# Patient Record
Sex: Female | Born: 1995 | Race: White | Hispanic: No | Marital: Single | Smoking: Never smoker
Health system: Southern US, Community
[De-identification: ages and names within clinical notes are randomized; demographics above are authoritative.]

## PROBLEM LIST (undated history)

## (undated) DIAGNOSIS — J302 Other seasonal allergic rhinitis: Secondary | ICD-10-CM

## (undated) HISTORY — DX: Other seasonal allergic rhinitis: J30.2

---
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
## 2015-02-06 ENCOUNTER — Encounter: Payer: Self-pay | Admitting: Pulmonary Disease

## 2015-02-06 ENCOUNTER — Other Ambulatory Visit (INDEPENDENT_AMBULATORY_CARE_PROVIDER_SITE_OTHER): Payer: BC Managed Care – PPO

## 2015-02-06 ENCOUNTER — Ambulatory Visit (INDEPENDENT_AMBULATORY_CARE_PROVIDER_SITE_OTHER): Payer: BC Managed Care – PPO | Admitting: Pulmonary Disease

## 2015-02-06 VITALS — BP 116/62 | HR 85 | Temp 97.6°F | Ht 67.0 in | Wt 139.8 lb

## 2015-02-06 DIAGNOSIS — J45990 Exercise induced bronchospasm: Secondary | ICD-10-CM

## 2015-02-06 LAB — CBC WITH DIFFERENTIAL/PLATELET
BASOS ABS: 0 10*3/uL (ref 0.0–0.1)
BASOS PCT: 0.3 % (ref 0.0–3.0)
EOS ABS: 0.1 10*3/uL (ref 0.0–0.7)
Eosinophils Relative: 1.1 % (ref 0.0–5.0)
HEMATOCRIT: 42.6 % (ref 36.0–46.0)
HEMOGLOBIN: 14.6 g/dL (ref 12.0–15.0)
LYMPHS PCT: 28.6 % (ref 12.0–46.0)
Lymphs Abs: 3 10*3/uL (ref 0.7–4.0)
MCHC: 34.3 g/dL (ref 30.0–36.0)
MCV: 87.7 fl (ref 78.0–100.0)
MONOS PCT: 5.1 % (ref 3.0–12.0)
Monocytes Absolute: 0.5 10*3/uL (ref 0.1–1.0)
NEUTROS ABS: 6.8 10*3/uL (ref 1.4–7.7)
Neutrophils Relative %: 64.9 % (ref 43.0–77.0)
PLATELETS: 389 10*3/uL (ref 150.0–400.0)
RBC: 4.86 Mil/uL (ref 3.87–5.11)
RDW: 13.2 % (ref 11.5–15.5)
WBC: 10.5 10*3/uL (ref 4.0–10.5)

## 2015-02-06 MED ORDER — BECLOMETHASONE DIPROPIONATE 40 MCG/ACT IN AERS: 1.0000 | INHALATION_SPRAY | Freq: Two times a day (BID) | RESPIRATORY_TRACT | Status: DC

## 2015-02-06 NOTE — Progress Notes (Signed)
   Subjective:    Patient ID: Melanie Drake, female    DOB: 1995-05-21, 19 y.o.   MRN: [id number]  HPI Consult for evaluation of asthma.  Melanie Drake is a 19 year old with a diagnosis of asthma since the age of 40. Her symptoms are induced mainly by exercise. She has been maintained on albuterol when necessary. She was prescribed Qvar 2 years ago by her primary care physician. She took it for a couple of months and then stopped.   She goes to school in [place]. There she has to walk up a hill to get her classes. She reports that her shortness of breath has returned in the month of August when she started her classes. She had to use her albuterol a couple of times every day. This has since improved and she is taking her albuterol 1-2 times per week. She describes her symptoms as chest tightness and shortness of breath wheezing with cough.  Past medical history/past social history: Exercise-induced asthma. Seasonal allergies.  Allergies: None.  Past Medical History  Diagnosis Date  . Asthma 05/2003    dx. by ped. doc.   '  Current outpatient prescriptions:  .  albuterol (PROVENTIL,VENTOLIN) 90 MCG/ACT inhaler, Inhale 2 puffs into the lungs every 6 (six) hours as needed for wheezing., Disp: 17 g, Rfl: 6 .  beclomethasone (QVAR) 40 MCG/ACT inhaler, Inhale 1 puff into the lungs 2 (two) times daily., Disp: 1 Inhaler, Rfl: 6 .  beclomethasone (QVAR) 80 MCG/ACT inhaler, Inhale 1 puff into the lungs 2 (two) times daily., Disp: 1 Inhaler, Rfl: 0  Review of Systems Complains of chest tightness, shortness of breath on exertion, cough. No mucus production, no chest pain, palpitations No nausea, vomiting, diarrhea. All other review of systems are negative.  Blood pressure 116/62, pulse 85, temperature 97.6 F (36.4 C), temperature source Oral, height  (1.702 m), weight 139 lb 12.8 oz (63.413 kg), SpO2 96 %.     Objective:   Physical Exam Gen.: Awake alert, no distress. HEENT: Moist  mucous membranes, PERRLA Neck: Supple no JVD RS: No wheeze or crackles. CVS: S1-S2 heard, no MRG. Abdomen: Soft, positive bowel sounds Extremities: No edema. Skin: Intact.    Assessment & Plan:   Exercise-induced asthma. Moderate persistent asthma.  Her symptoms have previously been controlled with albuterol when necessary. But this year she is having to use her rescue inhaler more often than usual. I will get PFTs with a bronchodilator response and also check a CBC with differential, IgE. She will benefit from long-term controller medication. I will restart her on the Qvar. Continue albuterol as needed and before exercise. She will return to clinic in a couple of months to assess response to therapy.  Plan: - Continue albuterol PRN. - Start Qvar inhaler - PFTs, CBC with diff, IgE  Follow up in 1-2 months.  Chilton Greathouse MD [facility] Pager [phone] If no answer or after 3pm call: [phone] 02/06/2015, 2:11 PM -

## 2015-02-06 NOTE — Patient Instructions (Signed)
Continue albuterol as needed Start Qvar inhaler Get lung function tests and blood tests.  Return in 1-2 months.

## 2015-02-07 ENCOUNTER — Ambulatory Visit (HOSPITAL_COMMUNITY)
Admission: RE | Admit: 2015-02-07 | Discharge: 2015-02-07 | Disposition: A | Discharge status: Home / Self Care | Payer: BC Managed Care – PPO | Source: Ambulatory Visit | Attending: Pulmonary Disease | Admitting: Pulmonary Disease

## 2015-02-07 DIAGNOSIS — J45990 Exercise induced bronchospasm: Secondary | ICD-10-CM | POA: Diagnosis present

## 2015-02-07 LAB — PULMONARY FUNCTION TEST
DL/VA % pred: 85 %
DL/VA: 4.4 ml/min/mmHg/L
DLCO COR % PRED: 79 %
DLCO COR: 22.37 ml/min/mmHg
DLCO UNC: 23.16 ml/min/mmHg
DLCO unc % pred: 81 %
FEF 25-75 POST: 3.65 L/s
FEF 25-75 PRE: 2.8 L/s
FEF2575-%Change-Post: 30 %
FEF2575-%PRED-PRE: 72 %
FEF2575-%Pred-Post: 93 %
FEV1-%Change-Post: 7 %
FEV1-%PRED-POST: 91 %
FEV1-%Pred-Pre: 85 %
FEV1-POST: 3.3 L
FEV1-Pre: 3.07 L
FEV1FVC-%Change-Post: 3 %
FEV1FVC-%Pred-Pre: 93 %
FEV6-%CHANGE-POST: 3 %
FEV6-%PRED-PRE: 92 %
FEV6-%Pred-Post: 95 %
FEV6-POST: 3.97 L
FEV6-Pre: 3.83 L
FEV6FVC-%PRED-POST: 100 %
FEV6FVC-%Pred-Pre: 100 %
FVC-%CHANGE-POST: 3 %
FVC-%Pred-Post: 95 %
FVC-%Pred-Pre: 92 %
FVC-Post: 3.97 L
FVC-Pre: 3.83 L
POST FEV6/FVC RATIO: 100 %
PRE FEV1/FVC RATIO: 80 %
Post FEV1/FVC ratio: 83 %
Pre FEV6/FVC Ratio: 100 %
RV % pred: 129 %
RV: 1.76 L
TLC % PRED: 102 %
TLC: 5.62 L

## 2015-02-07 LAB — IGE: IgE (Immunoglobulin E), Serum: 272 kU/L — ABNORMAL HIGH (ref <115)

## 2015-02-07 MED ORDER — ALBUTEROL SULFATE (2.5 MG/3ML) 0.083% IN NEBU
2.5000 mg | INHALATION_SOLUTION | Freq: Once | RESPIRATORY_TRACT | Status: AC
  Administered 2015-02-07: 2.5 mg via RESPIRATORY_TRACT

## 2015-03-07 ENCOUNTER — Telehealth: Payer: Self-pay | Admitting: Pulmonary Disease

## 2015-03-07 MED ORDER — ALBUTEROL SULFATE HFA 108 (90 BASE) MCG/ACT IN AERS: 2.0000 | INHALATION_SPRAY | Freq: Four times a day (QID) | RESPIRATORY_TRACT | Status: DC | PRN

## 2015-03-07 NOTE — Telephone Encounter (Signed)
Spoke with pt's mother. Rx has been sent in. Nothing further was needed.

## 2015-03-27 ENCOUNTER — Telehealth: Payer: Self-pay | Admitting: Behavioral Health

## 2015-03-27 NOTE — Telephone Encounter (Signed)
Unable to reach patient at time of Pre-Visit Call.  Left message for patient to return call when available.    

## 2015-03-28 ENCOUNTER — Encounter: Payer: Self-pay | Admitting: Family

## 2015-03-28 ENCOUNTER — Ambulatory Visit (INDEPENDENT_AMBULATORY_CARE_PROVIDER_SITE_OTHER): Payer: BC Managed Care – PPO | Admitting: Family

## 2015-03-28 VITALS — BP 122/66 | HR 70 | Temp 97.9°F | Resp 16 | Ht 67.0 in | Wt 144.8 lb

## 2015-03-28 DIAGNOSIS — J302 Other seasonal allergic rhinitis: Secondary | ICD-10-CM

## 2015-03-28 DIAGNOSIS — J45909 Unspecified asthma, uncomplicated: Secondary | ICD-10-CM | POA: Insufficient documentation

## 2015-03-28 DIAGNOSIS — J45990 Exercise induced bronchospasm: Secondary | ICD-10-CM

## 2015-03-28 DIAGNOSIS — J4540 Moderate persistent asthma, uncomplicated: Secondary | ICD-10-CM

## 2015-03-28 DIAGNOSIS — Z23 Encounter for immunization: Secondary | ICD-10-CM | POA: Diagnosis not present

## 2015-03-28 MED ORDER — BECLOMETHASONE DIPROPIONATE 40 MCG/ACT IN AERS: 1.0000 | INHALATION_SPRAY | Freq: Two times a day (BID) | RESPIRATORY_TRACT | Status: DC

## 2015-03-28 NOTE — Assessment & Plan Note (Signed)
Uncontrolled. Resume qvar, continue albuterol prn.
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
## 2015-03-28 NOTE — Progress Notes (Signed)
Pre visit review using our clinic review tool, if applicable. No additional management support is needed unless otherwise documented below in the visit note. 

## 2015-03-28 NOTE — Assessment & Plan Note (Signed)
Stable. Continue claritin or zyrtec prn.

## 2015-03-28 NOTE — Patient Instructions (Signed)
Restart qvar. Continue albuterol as needed.  Welcome to [facility]!

## 2015-04-04 NOTE — Addendum Note (Signed)
Addended by: Mervin KungFERGERSON, Satoru Milich A on: 04/04/2015 06:01 PM   Modules accepted: Orders

## 2015-04-17 ENCOUNTER — Telehealth: Payer: Self-pay | Admitting: *Deleted

## 2015-04-17 NOTE — Telephone Encounter (Signed)
I am trying to find pt in the [facility] database and need to verify her middle initial and has she had a previous phone # of [phone]. Left message for pt to return my call.

## 2015-04-17 NOTE — Telephone Encounter (Signed)
Pt returned your call. Pt says that her middle initial is M and no she never had the phone number below.    Advised pt if a call back is needed, you would be glad to fu with her.    Thanks.

## 2015-04-18 ENCOUNTER — Encounter: Payer: Self-pay | Admitting: Pulmonary Disease

## 2015-04-18 ENCOUNTER — Ambulatory Visit (INDEPENDENT_AMBULATORY_CARE_PROVIDER_SITE_OTHER): Payer: BC Managed Care – PPO | Admitting: Pulmonary Disease

## 2015-04-18 VITALS — BP 124/78 | HR 97 | Ht 67.0 in | Wt 142.8 lb

## 2015-04-18 DIAGNOSIS — J4540 Moderate persistent asthma, uncomplicated: Secondary | ICD-10-CM | POA: Diagnosis not present

## 2015-04-18 DIAGNOSIS — J45990 Exercise induced bronchospasm: Secondary | ICD-10-CM

## 2015-04-18 NOTE — Patient Instructions (Signed)
Return to clinic in 6 months. Continue taking the Qvar and albuterol rescue inhaler as ordered.

## 2015-04-18 NOTE — Progress Notes (Signed)
   Subjective:    Patient ID: Melanie Drake, female    DOB: [birth year], 19 y.o.   MRN: [id number]  PROBLEM LIST: Moderate persistent asthma Exercise-induced asthma.  HPI Ms. Melanie Drake is a 19 year old with a diagnosis of asthma since the age of 19. Her symptoms are induced mainly by exercise. She has been maintained on albuterol when necessary. She was prescribed Qvar 2 years ago by her primary care physician. She took it for a couple of months and then stopped.   [place] in [place] There she has to walk up a hill to get her classes. She reports that her shortness of breath has returned in the month of August when she started her classes. She had to use her albuterol a couple of times every day. This has since improved and she is taking her albuterol 1-2 times per week. She describes her symptoms as chest tightness and shortness of breath wheezing with cough.  Interim history: At the last visit she was restarted on Qvar. She had PFTs done which showed normal function. Today in the office she reports that her symptoms have improved by 90%. She hardly uses her rescue inhaler now.  PFTs [02/07/15] FVC 2.83 [92%] FEV1 3.07 [85%] F/F 80 FEF 25-75 percent 2.80 [72%], Post bronchodilator 3.65 [93%] +30% change TLC 5.5 [102%] DLCO 81% No obstruction by spirometry, reduction in mid flow rates suggestive of small airway disease. No restriction, normal diffusion capacity  [02/06/15] CBC- no eosinophilia IgE- 272  Past medical history/past social history: Exercise-induced asthma. Seasonal allergies.  Allergies: None.  Past Medical History  Diagnosis Date  . Asthma 05/2003    dx. by ped. doc.  . Seasonal allergies    '  Current outpatient prescriptions:  .  albuterol (PROVENTIL HFA;VENTOLIN HFA) 108 (90 BASE) MCG/ACT inhaler, Inhale 2 puffs into the lungs every 6 (six) hours as needed for wheezing or shortness of breath., Disp: 1 Inhaler, Rfl: 5 .  beclomethasone (QVAR) 40 MCG/ACT  inhaler, Inhale 1 puff into the lungs 2 (two) times daily., Disp: 1 Inhaler, Rfl: 3  Review of Systems Complains of chest tightness, shortness of breath on exertion, cough. No mucus production, no chest pain, palpitations No nausea, vomiting, diarrhea. All other review of systems are negative.     Objective:   Physical Exam   Blood pressure 124/78, pulse 97, height 5\' 7"  (1.702 m), weight 142 lb 12.8 oz (64.774 kg), last menstrual period 03/19/2015, SpO2 99 %.  Gen: Awake alert, no distress. HEENT: Moist mucous membranes, PERRLA Neck: Supple no JVD RS: No wheeze or crackles. CVS: S1-S2 heard, no MRG. Abdomen: Soft, positive bowel sounds Extremities: No edema. Skin: Intact.    Assessment & Plan:   Her symptoms have improved with Qvar. She hardly needs to use the albuterol inhaler. Review of her PFTs did not show any overt obstruction by F/F ratio however she has reduction in mid flow rates suggestive of small airways disease. She does not have any eosinophilia in her blood but she has an elevated IgE.  Plan: -Continue Qvar, albuterol rescue inhaler.  Return to clinic in 6 months   Chilton GreathousePraveen Jarvin Ogren MD [facility] Pager [phone] If no answer or after 3pm call: [phone] 04/18/2015, 1:53 PM -

## 2015-04-23 NOTE — Telephone Encounter (Signed)
Left message for pt's mother to return my call to verify if below # was a previous # for her.

## 2015-04-24 NOTE — Telephone Encounter (Signed)
Spoke with pt's mom. Was not previous # of hers. Googled # and it belongs to the health department. Immunizations entered under [facility].

## 2015-04-24 NOTE — Telephone Encounter (Signed)
Mother called and said pt phone # is [phone]

## 2015-07-16 ENCOUNTER — Telehealth: Payer: Self-pay | Admitting: Pulmonary Disease

## 2015-07-16 MED ORDER — ALBUTEROL SULFATE HFA 108 (90 BASE) MCG/ACT IN AERS: 2.0000 | INHALATION_SPRAY | Freq: Four times a day (QID) | RESPIRATORY_TRACT | Status: DC | PRN

## 2015-07-16 NOTE — Telephone Encounter (Signed)
[facility] [place] [place], states that her Ventolin is no longer covered.  Proair is covered by insurance. Gave verbal for okay to change. Nothing further needed.

## 2015-12-31 ENCOUNTER — Encounter: Payer: Self-pay | Admitting: Pulmonary Disease

## 2015-12-31 ENCOUNTER — Ambulatory Visit (INDEPENDENT_AMBULATORY_CARE_PROVIDER_SITE_OTHER): Payer: BC Managed Care – PPO | Admitting: Pulmonary Disease

## 2015-12-31 VITALS — BP 122/80 | HR 72 | Ht 67.0 in | Wt 146.2 lb

## 2015-12-31 DIAGNOSIS — J4540 Moderate persistent asthma, uncomplicated: Secondary | ICD-10-CM

## 2015-12-31 DIAGNOSIS — Z23 Encounter for immunization: Secondary | ICD-10-CM

## 2015-12-31 DIAGNOSIS — J45990 Exercise induced bronchospasm: Secondary | ICD-10-CM | POA: Diagnosis not present

## 2015-12-31 NOTE — Patient Instructions (Signed)
Continue using the Qvar inhaler. Flu vaccination today.  Return to clinic as needed.

## 2015-12-31 NOTE — Progress Notes (Signed)
Melanie Drake    [id number]    01/19/1996  Primary Care Physician:Drake,Melanie S., NP  Referring Physician: Sandford CrazeMelissa O'Sullivan, NP [address] [place], [place] [id number]  Chief complaint:   Follow up for  Moderate persistent asthma Exercise-induced asthma.  HPI: Ms. Melanie Drake is a 20 year old with a diagnosis of asthma since the age of 20. Her symptoms are induced mainly by exercise. She has been maintained on albuterol when necessary. She was prescribed Qvar 2 years ago by her primary care physician. She took it for a couple of months and then stopped.   [place] There she has to walk up a hill to get her classes. She reports that her shortness of breath has returned in the month of August when she started her classes. She had to use her albuterol a couple of times every day. This has since improved and she is taking her albuterol 1-2 times per week. She describes her symptoms as chest tightness and shortness of breath wheezing with cough.  Interim history: She continues to do well on Qvar. Her symptoms have essentially resolved. She hardly needs to use her rescue inhaler.   Outpatient Encounter Prescriptions as of 12/31/2015  Medication Sig  . albuterol (PROAIR HFA) 108 (90 Base) MCG/ACT inhaler Inhale 2 puffs into the lungs every 6 (six) hours as needed for wheezing or shortness of breath.  . beclomethasone (QVAR) 40 MCG/ACT inhaler Inhale 1 puff into the lungs 2 (two) times daily.   No facility-administered encounter medications on file as of 12/31/2015.     Allergies as of 12/31/2015 - Review Complete 12/31/2015  Allergen Reaction Noted  . Septra [bactrim] Rash 09/11/2010    Past Medical History:  Diagnosis Date  . Asthma 05/2003   dx. by ped. doc.  . Seasonal allergies     History reviewed. No pertinent surgical history.  Family History  Problem Relation Age of Onset  . Hypertension Mother   . Hypothyroidism Mother    . Multiple sclerosis Mother   . Bladder Cancer Maternal Grandfather   . Hyperlipidemia Father   . Diabetes Neg Hx   . Kidney disease Neg Hx     Social History   Social History  . Marital status: Single    Spouse name: N/A  . Number of children: N/A  . Years of education: N/A   Occupational History  . Not on file.   Social History Main Topics  . Smoking status: Never Smoker  . Smokeless tobacco: Never Used  . Alcohol use No  . Drug use: No  . Sexual activity: Not on file   Other Topics Concern  . Not on file   Social History Narrative   Senior in college   Older brother- married   Enjoys reading, television   No pets       Review of systems: Review of Systems  Constitutional: Negative for fever and chills.  HENT: Negative.   Eyes: Negative for blurred vision.  Respiratory: as per HPI  Cardiovascular: Negative for chest pain and palpitations.  Gastrointestinal: Negative for vomiting, diarrhea, blood per rectum. Genitourinary: Negative for dysuria, urgency, frequency and hematuria.  Musculoskeletal: Negative for myalgias, back pain and joint pain.  Skin: Negative for itching and rash.  Neurological: Negative for dizziness, tremors, focal weakness, seizures and loss of consciousness.  Endo/Heme/Allergies: Negative for environmental allergies.  Psychiatric/Behavioral: Negative for depression, suicidal ideas and hallucinations.  All other  systems reviewed and are negative.  Physical Exam: Blood pressure 122/80, pulse 72, height 5\' 7"  (1.702 m), weight 146 lb 3.2 oz (66.3 kg), SpO2 100 %. Gen:      No acute distress HEENT:  EOMI, sclera anicteric Neck:     No masses; no thyromegaly Lungs:    Clear to auscultation bilaterally; normal respiratory effort CV:         Regular rate and rhythm; no murmurs Abd:      + bowel sounds; soft, non-tender; no palpable masses, no distension Ext:    No edema; adequate peripheral perfusion Skin:      Warm and dry; no  rash Neuro: alert and oriented x 3 Psych: normal mood and affect  Data Reviewed: PFTs [02/07/15] FVC 2.83 [92%] FEV1 3.07 [85%] F/F 80 FEF 25-75 percent 2.80 [72%], Post bronchodilator 3.65 [93%] +30% change TLC 5.5 [102%] DLCO 81% No obstruction by spirometry, reduction in mid flow rates suggestive of small airway disease. No restriction, normal diffusion capacity  [02/06/15] CBC- no eosinophilia IgE- 272  Assessment:  Her symptoms have improved with Qvar. She hardly needs to use the albuterol inhaler. Review of her PFTs did not show any overt obstruction by F/F ratio however she has reduction in mid flow rates suggestive of small airways disease. She does not have any eosinophilia in her blood but she has an elevated IgE.  She can follow up with her PCP for further management of asthma. We will be available as needed in case her symptom control worsens.  Plan/Recommendations: -Continue Qvar, albuterol rescue inhaler.  Return as needed.  Chilton Greathouse MD [facility] Pager [phone] 12/31/2015, 12:55 PM  CC: Sandford Craze, NP

## 2016-04-17 ENCOUNTER — Telehealth: Payer: Self-pay | Admitting: Family

## 2016-04-18 NOTE — Telephone Encounter (Signed)
QVAR refill was sent to pharmacy yesterday. Pt last seen by PCP 03/2015 and needs annual cpe.  Please call pt to arrange appointment soon. Thanks!

## 2016-04-18 NOTE — Telephone Encounter (Signed)
LVM informing patient she is due to come in for an appointment and to call the office.

## 2017-06-05 ENCOUNTER — Ambulatory Visit: Payer: BC Managed Care – PPO | Admitting: Family

## 2017-06-05 ENCOUNTER — Encounter: Payer: Self-pay | Admitting: Family

## 2017-06-05 VITALS — BP 113/71 | HR 91 | Temp 97.7°F | Resp 16 | Ht 67.0 in | Wt 127.0 lb

## 2017-06-05 DIAGNOSIS — Z23 Encounter for immunization: Secondary | ICD-10-CM | POA: Diagnosis not present

## 2017-06-05 DIAGNOSIS — R55 Syncope and collapse: Secondary | ICD-10-CM

## 2017-06-05 DIAGNOSIS — A084 Viral intestinal infection, unspecified: Secondary | ICD-10-CM

## 2017-06-05 LAB — CBC WITH DIFFERENTIAL/PLATELET
BASOS PCT: 0.2 % (ref 0.0–3.0)
Basophils Absolute: 0 10*3/uL (ref 0.0–0.1)
EOS PCT: 0.8 % (ref 0.0–5.0)
Eosinophils Absolute: 0 10*3/uL (ref 0.0–0.7)
HCT: 45 % (ref 36.0–46.0)
Hemoglobin: 15.2 g/dL — ABNORMAL HIGH (ref 12.0–15.0)
LYMPHS ABS: 1.5 10*3/uL (ref 0.7–4.0)
Lymphocytes Relative: 26.1 % (ref 12.0–46.0)
MCHC: 33.7 g/dL (ref 30.0–36.0)
MCV: 89.2 fl (ref 78.0–100.0)
Monocytes Absolute: 0.5 10*3/uL (ref 0.1–1.0)
Monocytes Relative: 8.8 % (ref 3.0–12.0)
NEUTROS ABS: 3.7 10*3/uL (ref 1.4–7.7)
NEUTROS PCT: 64.1 % (ref 43.0–77.0)
PLATELETS: 296 10*3/uL (ref 150.0–400.0)
RBC: 5.05 Mil/uL (ref 3.87–5.11)
RDW: 12.9 % (ref 11.5–15.5)
WBC: 5.7 10*3/uL (ref 4.0–10.5)

## 2017-06-05 LAB — COMPREHENSIVE METABOLIC PANEL
ALK PHOS: 47 U/L (ref 39–117)
ALT: 8 U/L (ref 0–35)
AST: 12 U/L (ref 0–37)
Albumin: 4.1 g/dL (ref 3.5–5.2)
BUN: 16 mg/dL (ref 6–23)
CO2: 26 meq/L (ref 19–32)
Calcium: 9 mg/dL (ref 8.4–10.5)
Chloride: 107 mEq/L (ref 96–112)
Creatinine, Ser: 0.85 mg/dL (ref 0.40–1.20)
GFR: 87.56 mL/min (ref >60.00)
GLUCOSE: 113 mg/dL — AB (ref 70–99)
POTASSIUM: 4.3 meq/L (ref 3.5–5.1)
SODIUM: 139 meq/L (ref 135–145)
TOTAL PROTEIN: 7.1 g/dL (ref 6.0–8.3)
Total Bilirubin: 0.4 mg/dL (ref 0.2–1.2)

## 2017-06-05 NOTE — Patient Instructions (Signed)
Continue to stay well hydrated and advance your diet as tolerated. Go to the ER if you develop recurrent fainting.  I think that your most recent episode was related to dehydration/stomach virus.  Please complete lab work prior to leaving.
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
## 2017-09-25 ENCOUNTER — Encounter: Payer: Self-pay | Admitting: Family

## 2017-09-25 ENCOUNTER — Ambulatory Visit (INDEPENDENT_AMBULATORY_CARE_PROVIDER_SITE_OTHER): Payer: BC Managed Care – PPO | Admitting: Family

## 2017-09-25 VITALS — BP 114/70 | HR 74 | Temp 98.0°F | Resp 16 | Ht 66.5 in | Wt 116.6 lb

## 2017-09-25 DIAGNOSIS — Z0000 Encounter for general adult medical examination without abnormal findings: Secondary | ICD-10-CM

## 2017-09-25 LAB — CBC WITH DIFFERENTIAL/PLATELET
BASOS PCT: 0.6 % (ref 0.0–3.0)
Basophils Absolute: 0 10*3/uL (ref 0.0–0.1)
EOS ABS: 0.1 10*3/uL (ref 0.0–0.7)
Eosinophils Relative: 1.7 % (ref 0.0–5.0)
HEMATOCRIT: 42.3 % (ref 36.0–46.0)
Hemoglobin: 14.2 g/dL (ref 12.0–15.0)
LYMPHS PCT: 34.6 % (ref 12.0–46.0)
Lymphs Abs: 2 10*3/uL (ref 0.7–4.0)
MCHC: 33.6 g/dL (ref 30.0–36.0)
MCV: 89.4 fl (ref 78.0–100.0)
MONOS PCT: 5.9 % (ref 3.0–12.0)
Monocytes Absolute: 0.3 10*3/uL (ref 0.1–1.0)
Neutro Abs: 3.4 10*3/uL (ref 1.4–7.7)
Neutrophils Relative %: 57.2 % (ref 43.0–77.0)
Platelets: 270 10*3/uL (ref 150.0–400.0)
RBC: 4.73 Mil/uL (ref 3.87–5.11)
RDW: 12.7 % (ref 11.5–15.5)
WBC: 5.9 10*3/uL (ref 4.0–10.5)

## 2017-09-25 LAB — BASIC METABOLIC PANEL
BUN: 11 mg/dL (ref 6–23)
CHLORIDE: 105 meq/L (ref 96–112)
CO2: 26 meq/L (ref 19–32)
CREATININE: 0.85 mg/dL (ref 0.40–1.20)
Calcium: 9.7 mg/dL (ref 8.4–10.5)
GFR: 87.33 mL/min (ref >60.00)
GLUCOSE: 95 mg/dL (ref 70–99)
Potassium: 4.9 mEq/L (ref 3.5–5.1)
Sodium: 138 mEq/L (ref 135–145)

## 2017-09-25 LAB — TSH: TSH: 1.92 u[IU]/mL (ref 0.35–4.50)

## 2017-09-25 LAB — URINALYSIS, ROUTINE W REFLEX MICROSCOPIC
BILIRUBIN URINE: NEGATIVE
Hgb urine dipstick: NEGATIVE
Ketones, ur: NEGATIVE
LEUKOCYTES UA: NEGATIVE
Nitrite: NEGATIVE
RBC / HPF: NONE SEEN (ref 0–?)
Specific Gravity, Urine: 1.02 (ref 1.000–1.030)
Total Protein, Urine: NEGATIVE
Urine Glucose: NEGATIVE
Urobilinogen, UA: 0.2 (ref 0.0–1.0)
WBC UA: NONE SEEN (ref 0–?)
pH: 7.5 (ref 5.0–8.0)

## 2017-09-25 LAB — HEPATIC FUNCTION PANEL
ALBUMIN: 4.5 g/dL (ref 3.5–5.2)
ALT: 10 U/L (ref 0–35)
AST: 12 U/L (ref 0–37)
Alkaline Phosphatase: 48 U/L (ref 39–117)
Bilirubin, Direct: 0.1 mg/dL (ref 0.0–0.3)
TOTAL PROTEIN: 6.8 g/dL (ref 6.0–8.3)
Total Bilirubin: 0.7 mg/dL (ref 0.2–1.2)

## 2017-09-25 LAB — LIPID PANEL
CHOLESTEROL: 191 mg/dL (ref 0–200)
HDL: 72.9 mg/dL (ref >39.00)
LDL Cholesterol: 107 mg/dL — ABNORMAL HIGH (ref 0–99)
NonHDL: 117.67
TRIGLYCERIDES: 51 mg/dL (ref 0.0–149.0)
Total CHOL/HDL Ratio: 3
VLDL: 10.2 mg/dL (ref 0.0–40.0)

## 2017-09-25 MED ORDER — ALBUTEROL SULFATE HFA 108 (90 BASE) MCG/ACT IN AERS: 2.0000 | INHALATION_SPRAY | Freq: Four times a day (QID) | RESPIRATORY_TRACT | 5 refills | Status: DC | PRN

## 2017-09-25 MED ORDER — BECLOMETHASONE DIPROPIONATE 40 MCG/ACT IN AERS: INHALATION_SPRAY | RESPIRATORY_TRACT | 5 refills | Status: DC

## 2017-09-25 NOTE — Progress Notes (Signed)
Subjective:    Patient ID: Melanie Drake, female    DOB: [birth year], 22 y.o.   MRN: [id number]  HPI   Patient presents today for complete physical.  Immunizations: up to date Diet: healthy Exercise: walks in her neighborhood 2 miles a day Pap Smear: 09/02/16 Dental: up to date Vision: up to date  Wt Readings from Last 3 Encounters:  09/25/17 116 lb 9.6 oz (52.9 kg)  06/05/17 127 lb (57.6 kg)  12/31/15 146 lb 3.2 oz (66.3 kg)      Review of Systems  Constitutional: Positive for unexpected weight change.  HENT: Negative for hearing loss and rhinorrhea.   Eyes: Negative for visual disturbance.  Respiratory: Negative for cough.   Cardiovascular: Negative for leg swelling.  Gastrointestinal: Negative for constipation, diarrhea and nausea.  Genitourinary: Negative for dysuria, frequency and hematuria.  Musculoskeletal: Negative for arthralgias and myalgias.  Skin: Negative for rash.  Neurological:       Notes "stress headaches" some days  Hematological: Negative for adenopathy.  Psychiatric/Behavioral:       Denies depression/anxiety   Past Medical History:  Diagnosis Date  . Asthma 05/2003   dx. by ped. doc.  . Seasonal allergies      Social History   Socioeconomic History  . Marital status: Single    Spouse name: Not on file  . Number of children: Not on file  . Years of education: Not on file  . Highest education level: Not on file  Occupational History  . Occupation: Librarian, academic  Social Needs  . Financial resource strain: Not hard at all  . Food insecurity:    Worry: Never true    Inability: Never true  . Transportation needs:    Medical: No    Non-medical: No  Tobacco Use  . Smoking status: Never Smoker  . Smokeless tobacco: Never Used  Substance and Sexual Activity  . Alcohol use: No  . Drug use: No  . Sexual activity: Never  Lifestyle  . Physical activity:    Days per week: 0 days    Minutes per session: Not on file  . Stress: To some  extent  Relationships  . Social connections:    Talks on phone: More than three times a week    Gets together: Twice a week    Attends religious service: More than 4 times per year    Active member of club or organization: No    Attends meetings of clubs or organizations: Never    Relationship status: Not on file  . Intimate partner violence:    Fear of current or ex partner: Not on file    Emotionally abused: Not on file    Physically abused: Not on file    Forced sexual activity: Not on file  Other Topics Concern  . 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 No pertinent surgical history.  Family History  Problem Relation Age of Onset  . Hypertension Mother   . Hypothyroidism Mother   . Multiple sclerosis Mother   . Hyperlipidemia Father   . Bladder Cancer Maternal Grandfather   . Diabetes Neg Hx   . Kidney disease Neg Hx     Allergies  Allergen Reactions  . Septra [Bactrim] Rash    No current outpatient medications on file prior to visit.   No current  facility-administered medications on file prior to visit.     BP 114/70 (BP Location: Left Arm, Patient Position: Sitting, Cuff Size: Small)   Pulse 74   Temp 98 F (36.7 C) (Oral)   Resp 16   Ht 5' 6.5" (1.689 m)   Wt 116 lb 9.6 oz (52.9 kg)   LMP 08/31/2017 (Approximate)   SpO2 100%   BMI 18.54 kg/m       Objective:   Physical Exam  Physical Exam  Constitutional: She is oriented to person, place, and time. She appears well-developed and well-nourished. No distress.  HENT:  Head: Normocephalic and atraumatic.  Right Ear: Tympanic membrane and ear canal normal.  Left Ear: Tympanic membrane and ear canal normal.  Mouth/Throat: Oropharynx is clear and moist.  Eyes: Pupils are equal, round, and reactive to light. No scleral icterus.  Neck: Normal range of motion. No thyromegaly present.    Cardiovascular: Normal rate and regular rhythm.   No murmur heard. Pulmonary/Chest: Effort normal and breath sounds normal. No respiratory distress. He has no wheezes. She has no rales. She exhibits no tenderness.  Abdominal: Soft. Bowel sounds are normal. She exhibits no distension and no mass. There is no tenderness. There is no rebound and no guarding.  Musculoskeletal: She exhibits no edema.  Lymphadenopathy:    She has no cervical adenopathy.  Neurological: She is alert and oriented to person, place, and time. She has normal patellar reflexes. She exhibits normal muscle tone. Coordination normal.  Skin: Skin is warm and dry.  Psychiatric: She has a normal mood and affect. Her behavior is normal. Judgment and thought content normal.  Breast/pelvic: deferred to GYN         Assessment & Plan:    Preventative care- discussed healthy diet, exercise. She admits to not eating enough. Discussed goal weight of 120+. She understands that she needs to call if she has further weight loss. Obtain routine lab work. Immunizations reviewed and up to date.  She is up to date on Pap.     Assessment & Plan:

## 2017-09-25 NOTE — Patient Instructions (Signed)
Please complete lab work prior to leaving. Try to keep your weight above 120. Call if you have further weight loss.

## 2018-01-01 ENCOUNTER — Encounter: Payer: Self-pay | Admitting: Family

## 2018-01-01 ENCOUNTER — Ambulatory Visit (INDEPENDENT_AMBULATORY_CARE_PROVIDER_SITE_OTHER): Payer: BC Managed Care – PPO | Admitting: Family

## 2018-01-01 VITALS — BP 117/69 | HR 76 | Temp 98.2°F | Resp 18 | Ht 67.0 in | Wt 125.0 lb

## 2018-01-01 DIAGNOSIS — F418 Other specified anxiety disorders: Secondary | ICD-10-CM

## 2018-01-01 MED ORDER — SERTRALINE HCL 50 MG PO TABS: ORAL_TABLET | ORAL | 0 refills | Status: DC

## 2018-01-01 NOTE — Progress Notes (Signed)
Subjective:    Patient ID: Melanie Drake, female    DOB: 10-24-1995, 22 y.o.   MRN: [id number]  HPI  Patient is a 22 yr old female who presents today to discuss anxiety.  Left stressful job as a Librarian, academic a few weeks back. She had a retail job lined up but ultimately decided it was not a good thing for her so she is seeking alternative employment. She lives with her mom and dad.   Reports that her anxiety symptoms include:  occasional stomach cramping, tightness in her chest, sob, hands tremble.  Some nights she has trouble falling asleep.  Wt Readings from Last 3 Encounters:  01/01/18 125 lb (56.7 kg)  09/25/17 116 lb 9.6 oz (52.9 kg)  06/05/17 127 lb (57.6 kg)   Reports that she picks at her skin on her thumbs. "It's my nervous tic."   Has some depression symptoms.  Denies SI/HI.      Review of Systems See HPI  Past Medical History:  Diagnosis Date  . Asthma 05/2003   dx. by ped. doc.  . Seasonal allergies      Social History   Socioeconomic History  . Marital status: Single    Spouse name: Not on file  . Number of children: Not on file  . Years of education: Not on file  . Highest education level: Not on file  Occupational History  . Occupation: Librarian, academic  Social Needs  . Financial resource strain: Not hard at all  . Food insecurity:    Worry: Never true    Inability: Never true  . Transportation needs:    Medical: No    Non-medical: No  Tobacco Use  . Smoking status: Never Smoker  . Smokeless tobacco: Never Used  Substance and Sexual Activity  . Alcohol use: No  . Drug use: No  . Sexual activity: Never  Lifestyle  . Physical activity:    Days per week: 0 days    Minutes per session: Not on file  . Stress: To some extent  Relationships  . Social connections:    Talks on phone: More than three times a week    Gets together: Twice a week    Attends religious service: More than 4 times per year    Active member of club or organization: No    Attends meetings of clubs or organizations: Never    Relationship status: Not on file  . Intimate partner violence:    Fear of current or ex partner: Not on file    Emotionally abused: Not on file    Physically abused: Not on file    Forced sexual activity: Not on file  Other Topics Concern  . 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  Family History  Problem Relation Age of Onset  . Hypertension Mother   . Hypothyroidism Mother   . Multiple sclerosis Mother   . Hyperlipidemia Father   . Bladder Cancer Maternal Grandfather   . Diabetes Neg Hx   . Kidney disease Neg Hx     Allergies  Allergen Reactions  . Septra [Bactrim] Rash    Current Outpatient Medications on File Prior to Visit  Medication Sig Dispense Refill  . albuterol (PROAIR HFA) 108 (90 Base) MCG/ACT inhaler Inhale 2  puffs into the lungs every 6 (six) hours as needed for wheezing or shortness of breath. 1 Inhaler 5  . beclomethasone (QVAR) 40 MCG/ACT inhaler USE 1 PUFF TWICE DAILY. 8.7 g 5   No current facility-administered medications on file prior to visit.     BP 117/69 (Cuff Size: Normal)   Pulse 76   Temp 98.2 F (36.8 C) (Oral)   Resp 18   Ht 5\' 7"  (1.702 m)   Wt 125 lb (56.7 kg)   LMP 12/25/2017   SpO2 100%   BMI 19.58 kg/m       Objective:   Physical Exam  Constitutional: She is oriented to person, place, and time. She appears well-developed and well-nourished. No distress.  Neurological: She is alert and oriented to person, place, and time.  Psychiatric: Her behavior is normal. Judgment and thought content normal.  Slightly flat affect          Assessment & Plan:  Depression/anxiety- also has some OCD tendencies with skin picking. PHQ-9 Will initiate zoloft 50mg  once daily.  I instructed pt to start 1/2 tablet once daily for  1 week and then increase to a full tablet once daily on week two as tolerated.  We discussed common side effects such as nausea, drowsiness and weight gain.  Also discussed rare but serious side effect of suicide ideation.  She is instructed to discontinue medication go directly to ED if this occurs.  Pt verbalizes understanding.  I also suggested that she establish with a counselor. Plan follow up in 1 month to evaluate progress.     A total of 15 minutes were spent face-to-face with the patient during this encounter and over half of that time was spent on counseling and coordination of care. The patient was counseled on depression/anxiety/ocd and treatment options.

## 2018-01-01 NOTE — Patient Instructions (Signed)
Please begin zoloft 1/2 tab once daily for 1 week, then increase to a full tab once daily on week two. Please consider scheduling an appointment with a counselor.

## 2018-01-29 ENCOUNTER — Ambulatory Visit (INDEPENDENT_AMBULATORY_CARE_PROVIDER_SITE_OTHER): Payer: BC Managed Care – PPO | Admitting: Family

## 2018-01-29 ENCOUNTER — Encounter: Payer: Self-pay | Admitting: Family

## 2018-01-29 VITALS — BP 126/78 | HR 79 | Temp 98.3°F | Resp 16 | Ht 67.0 in | Wt 131.8 lb

## 2018-01-29 DIAGNOSIS — F329 Major depressive disorder, single episode, unspecified: Secondary | ICD-10-CM | POA: Diagnosis not present

## 2018-01-29 DIAGNOSIS — F419 Anxiety disorder, unspecified: Secondary | ICD-10-CM | POA: Diagnosis not present

## 2018-01-29 DIAGNOSIS — Z23 Encounter for immunization: Secondary | ICD-10-CM | POA: Diagnosis not present

## 2018-01-29 DIAGNOSIS — R635 Abnormal weight gain: Secondary | ICD-10-CM

## 2018-01-29 LAB — TSH: TSH: 4.05 u[IU]/mL (ref 0.35–4.50)

## 2018-01-29 MED ORDER — SERTRALINE HCL 50 MG PO TABS: 50.0000 mg | ORAL_TABLET | Freq: Every day | ORAL | 0 refills | Status: DC

## 2018-01-29 NOTE — Progress Notes (Signed)
Subjective:    Patient ID: Melanie Drake, female    DOB: [birth year], 22 y.o.   MRN: [id number]  HPI  Patietn is a 22 yr old female who presents today for follow up.  Depression/anxiety- Last visit she described increased anxiety symptoms which included:  occasional stomach cramping, tightness in her chest, sob, hands trembling, insomnia. She also described some ocd tendencies with skin picking.  We initiated zoloft. Reports that depression symptoms are improve.   She reports that her sleep is better, skin picking is improved.   She has gained 6 pounds since starting zoloft. Notes that she has been less active and eating more.   Wt Readings from Last 3 Encounters:  01/29/18 131 lb 12.8 oz (59.8 kg)  01/01/18 125 lb (56.7 kg)  09/25/17 116 lb 9.6 oz (52.9 kg)      Review of Systems See HPI  Past Medical History:  Diagnosis Date  . Asthma 05/2003   dx. by ped. doc.  . Seasonal allergies      Social History   Socioeconomic History  . Marital status: Single    Spouse name: Not on file  . Number of children: Not on file  . Years of education: Not on file  . Highest education level: Not on file  Occupational History  . Occupation: Librarian, academic  Social Needs  . Financial resource strain: Not hard at all  . Food insecurity:    Worry: Never true    Inability: Never true  . Transportation needs:    Medical: No    Non-medical: No  Tobacco Use  . Smoking status: Never Smoker  . Smokeless tobacco: Never Used  Substance and Sexual Activity  . Alcohol use: No  . Drug use: No  . Sexual activity: Never  Lifestyle  . Physical activity:    Days per week: 0 days    Minutes per session: Not on file  . Stress: To some extent  Relationships  . Social connections:    Talks on phone: More than three times a week    Gets together: Twice a week    Attends religious service: More than 4 times per year    Active member of club or organization: No    Attends meetings of clubs  or organizations: Never    Relationship status: Not on file  . Intimate partner violence:    Fear of current or ex partner: Not on file    Emotionally abused: Not on file    Physically abused: Not on file    Forced sexual activity: Not on file  Other Topics Concern  . 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  Family History  Problem Relation Age of Onset  . Hypertension Mother   . Hypothyroidism Mother   . Multiple sclerosis Mother   . Hyperlipidemia Father   . Bladder Cancer Maternal Grandfather   . Diabetes Neg Hx   . Kidney disease Neg Hx     Allergies  Allergen Reactions  . Septra [Bactrim] Rash    Current Outpatient Medications on File Prior to Visit  Medication Sig Dispense Refill  . albuterol (PROAIR HFA) 108 (90 Base) MCG/ACT inhaler Inhale 2 puffs into the lungs every 6 (six) hours as needed for wheezing or shortness of breath.  1 Inhaler 5  . beclomethasone (QVAR) 40 MCG/ACT inhaler USE 1 PUFF TWICE DAILY. 8.7 g 5   No current facility-administered medications on file prior to visit.     BP 126/78 (BP Location: Right Arm, Patient Position: Sitting, Cuff Size: Small)   Pulse 79   Temp 98.3 F (36.8 C) (Oral)   Resp 16   Ht 5\' 7"  (1.702 m)   Wt 131 lb 12.8 oz (59.8 kg)   LMP 01/22/2018 (Approximate)   SpO2 100%   BMI 20.64 kg/m       Objective:   Physical Exam  Constitutional: She is oriented to person, place, and time. She appears well-developed and well-nourished.  Musculoskeletal: She exhibits no edema.  Neurological: She is alert and oriented to person, place, and time.  Psychiatric: She has a normal mood and affect. Her behavior is normal. Judgment and thought content normal.          Assessment & Plan:  Anxiety/depression- improving on zoloft. Continue same.  Weight gain- we  discussed that this could be related to zoloft.  Will check TSH. I have advised the patient as follows:  Watch your weight, weight daily. Increase exercise, focus on healthy eating.  Let me know if weight >135.   If continued weight gain on zoloft may need to consider alternative rx.    Flu shot today

## 2018-01-29 NOTE — Patient Instructions (Signed)
Please continue zoloft once daily.  Watch your weight, weight daily. Increase exercise, focus on healthy eating.  Let me know if weight >135.

## 2018-05-07 ENCOUNTER — Ambulatory Visit: Payer: BC Managed Care – PPO | Admitting: Family

## 2018-05-07 ENCOUNTER — Encounter: Payer: Self-pay | Admitting: Family

## 2018-05-07 VITALS — BP 123/70 | HR 84 | Temp 98.9°F | Resp 16 | Ht 67.0 in | Wt 149.0 lb

## 2018-05-07 DIAGNOSIS — F418 Other specified anxiety disorders: Secondary | ICD-10-CM | POA: Diagnosis not present

## 2018-05-07 DIAGNOSIS — J069 Acute upper respiratory infection, unspecified: Secondary | ICD-10-CM | POA: Diagnosis not present

## 2018-05-07 DIAGNOSIS — H6123 Impacted cerumen, bilateral: Secondary | ICD-10-CM

## 2018-05-07 DIAGNOSIS — R635 Abnormal weight gain: Secondary | ICD-10-CM | POA: Diagnosis not present

## 2018-05-07 LAB — TSH: TSH: 2.86 u[IU]/mL (ref 0.35–4.50)

## 2018-05-07 MED ORDER — SERTRALINE HCL 25 MG PO TABS: ORAL_TABLET | ORAL | 0 refills | Status: DC

## 2018-05-07 NOTE — Progress Notes (Signed)
Subjective:    Patient ID: Melanie Drake, female    DOB: October 09, 1995, 23 y.o.   MRN: [id number]  HPI  Patient is a 23 yr old female who presents today for follow up. Last visit she reported improvement in her mood and ocd tendencies.  Sleep was better.  She was tolerating zoloft but did have a 6 pound weight gain. Reports that her mood has been better.  Notes that her ocd symptoms have gotten somewhat better.    Wt Readings from Last 3 Encounters:  05/07/18 149 lb (67.6 kg)  01/29/18 131 lb 12.8 oz (59.8 kg)  01/01/18 125 lb (56.7 kg)   She reports the she developed a cold the week of Thanksgiving.  Seemed to get better.  Yesterday woke up with nasal  congestion/coughing/sneeaing, ear congestion/am sore throat which she attributed to drainage. Denies fever.    Review of Systems Past Medical History:  Diagnosis Date  . Asthma 05/2003   dx. by ped. doc.  . Seasonal allergies      Social History   Socioeconomic History  . Marital status: Single    Spouse name: Not on file  . Number of children: Not on file  . Years of education: Not on file  . Highest education level: Not on file  Occupational History  . Occupation: Librarian, academicLegal assistant  Social Needs  . Financial resource strain: Not hard at all  . Food insecurity:    Worry: Never true    Inability: Never true  . Transportation needs:    Medical: No    Non-medical: No  Tobacco Use  . Smoking status: Never Smoker  . Smokeless tobacco: Never Used  Substance and Sexual Activity  . Alcohol use: No  . Drug use: No  . Sexual activity: Never  Lifestyle  . Physical activity:    Days per week: 0 days    Minutes per session: Not on file  . Stress: To some extent  Relationships  . Social connections:    Talks on phone: More than three times a week    Gets together: Twice a week    Attends religious service: More than 4 times per year    Active member of club or organization: No    Attends meetings of clubs or organizations:  Never    Relationship status: Not on file  . Intimate partner violence:    Fear of current or ex partner: Not on file    Emotionally abused: Not on file    Physically abused: Not on file    Forced sexual activity: Not on file  Other Topics Concern  . 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  Family History  Problem Relation Age of Onset  . Hypertension Mother   . Hypothyroidism Mother   . Multiple sclerosis Mother   . Hyperlipidemia Father   . Bladder Cancer Maternal Grandfather   . Diabetes Neg Hx   . Kidney disease Neg Hx     Allergies  Allergen Reactions  . Septra [Bactrim] Rash    Current Outpatient Medications on File Prior to Visit  Medication Sig Dispense Refill  . albuterol (PROAIR HFA) 108 (90 Base) MCG/ACT inhaler Inhale 2 puffs into the lungs every 6 (six) hours as needed for wheezing or shortness of  breath. 1 Inhaler 5  . beclomethasone (QVAR) 40 MCG/ACT inhaler USE 1 PUFF TWICE DAILY. 8.7 g 5  . sertraline (ZOLOFT) 50 MG tablet Take 1 tablet (50 mg total) by mouth daily. 90 tablet 0   No current facility-administered medications on file prior to visit.     BP 123/70 (BP Location: Right Arm, Patient Position: Sitting, Cuff Size: Small)   Pulse 84   Temp 98.9 F (37.2 C) (Oral)   Resp 16   Ht 5\' 7"  (1.702 m)   Wt 149 lb (67.6 kg)   SpO2 100%   BMI 23.34 kg/m       Objective:   Physical Exam Constitutional:      Appearance: She is well-developed.  HENT:     Head:     Comments: Bilateral cerumen impaction    Nose:     Right Sinus: No maxillary sinus tenderness or frontal sinus tenderness.     Left Sinus: No maxillary sinus tenderness or frontal sinus tenderness.  Neck:     Musculoskeletal: Neck supple.     Thyroid: No thyromegaly.  Cardiovascular:     Rate and Rhythm: Normal rate and  regular rhythm.     Heart sounds: Normal heart sounds. No murmur.  Pulmonary:     Effort: Pulmonary effort is normal. No respiratory distress.     Breath sounds: Normal breath sounds. No wheezing.  Lymphadenopathy:     Cervical: No cervical adenopathy.  Skin:    General: Skin is warm and dry.  Neurological:     Mental Status: She is alert and oriented to person, place, and time.  Psychiatric:        Behavior: Behavior normal.        Thought Content: Thought content normal.        Judgment: Judgment normal.           Assessment & Plan:  Depression/anxiety-this is currently stable.  She has had significant weight gain on SSRI.  She desires to try to wean off of the medication.  We discussed trial of a different medication however she wishes to hold off on this for now and we will see how she does with the taper.  Plan taper as outlined in AVS. Will check TSH as well given weight gain.  Cerumen impaction-cerumen was removed with irrigation.pt tolerated well.   Upper respiratory infection-symptoms most consistent with URI at this point.  Patient is advised on supportive measures including Mucinex as needed, Flonase spray.  She is advised to call if symptoms worsen or if not improved in 1 week.

## 2018-05-07 NOTE — Patient Instructions (Signed)
Please complete lab work prior to leaving. Decrease zoloft to 25mg  once daily for 2 weeks then 1/2 tab once daily for 1 week, then stop. Call if you have worsening depression during this time. Call if your cold symptoms worsen or if not improved in 1 week.

## 2018-06-04 ENCOUNTER — Encounter: Payer: Self-pay | Admitting: Family

## 2018-06-04 ENCOUNTER — Ambulatory Visit: Payer: BC Managed Care – PPO | Admitting: Family

## 2018-06-04 VITALS — BP 122/80 | HR 83 | Temp 97.7°F | Resp 16 | Ht 67.0 in | Wt 152.0 lb

## 2018-06-04 DIAGNOSIS — F419 Anxiety disorder, unspecified: Secondary | ICD-10-CM

## 2018-06-04 DIAGNOSIS — R635 Abnormal weight gain: Secondary | ICD-10-CM | POA: Diagnosis not present

## 2018-06-04 DIAGNOSIS — F329 Major depressive disorder, single episode, unspecified: Secondary | ICD-10-CM

## 2018-06-04 NOTE — Progress Notes (Signed)
Subjective:    Patient ID: Melanie Drake, female    DOB: 11/02/1995, 23 y.o.   MRN: [id number]  HPI  Melanie Drake is a 23 yr old female who presents today for follow up of her depression and anxiety. Last visit she was noted to have significant weight gain on zoloft. Reports that she has started exercising more. Reports that she feels more fatigued.  She notes that she has had some irritability and mood swings the last few days.  Her last dose of zoloft was 1 week ago.    Wt Readings from Last 3 Encounters:  06/04/18 152 lb (68.9 kg)  05/07/18 149 lb (67.6 kg)  01/29/18 131 lb 12.8 oz (59.8 kg)    Review of Systems    see HPI  Past Medical History:  Diagnosis Date  . Asthma 05/2003   dx. by ped. doc.  . Seasonal allergies      Social History   Socioeconomic History  . Marital status: Single    Spouse name: Not on file  . Number of children: Not on file  . Years of education: Not on file  . Highest education level: Not on file  Occupational History  . Occupation: Librarian, academic  Social Needs  . Financial resource strain: Not hard at all  . Food insecurity:    Worry: Never true    Inability: Never true  . Transportation needs:    Medical: No    Non-medical: No  Tobacco Use  . Smoking status: Never Smoker  . Smokeless tobacco: Never Used  Substance and Sexual Activity  . Alcohol use: No  . Drug use: No  . Sexual activity: Never  Lifestyle  . Physical activity:    Days per week: 0 days    Minutes per session: Not on file  . Stress: To some extent  Relationships  . Social connections:    Talks on phone: More than three times a week    Gets together: Twice a week    Attends religious service: More than 4 times per year    Active member of club or organization: No    Attends meetings of clubs or organizations: Never    Relationship status: Not on file  . Intimate partner violence:    Fear of current or ex partner: Not on file    Emotionally abused: Not on file   Physically abused: Not on file    Forced sexual activity: Not on file  Other Topics Concern  . 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  Family History  Problem Relation Age of Onset  . Hypertension Mother   . Hypothyroidism Mother   . Multiple sclerosis Mother   . Hyperlipidemia Father   . Bladder Cancer Maternal Grandfather   . Diabetes Neg Hx   . Kidney disease Neg Hx     Allergies  Allergen Reactions  . Septra [Bactrim] Rash    Current Outpatient Medications on File Prior to Visit  Medication Sig Dispense Refill  . albuterol (PROAIR HFA) 108 (90 Base) MCG/ACT inhaler Inhale 2 puffs into the lungs every 6 (six) hours as needed for wheezing or shortness of breath. 1 Inhaler 5  . beclomethasone (QVAR) 40 MCG/ACT inhaler USE 1 PUFF TWICE DAILY. 8.7 g 5   No  current facility-administered medications on file prior to visit.     BP 122/80 (BP Location: Right Arm, Patient Position: Sitting, Cuff Size: Normal)   Pulse 83   Temp 97.7 F (36.5 C) (Oral)   Resp 16   Ht 5\' 7"  (1.702 m)   Wt 152 lb (68.9 kg)   SpO2 98%   BMI 23.81 kg/m    Objective:   Physical Exam Constitutional:      Appearance: She is well-developed.  Neck:     Musculoskeletal: Neck supple.     Thyroid: No thyromegaly.  Cardiovascular:     Rate and Rhythm: Normal rate and regular rhythm.     Heart sounds: Normal heart sounds. No murmur.  Pulmonary:     Effort: Pulmonary effort is normal. No respiratory distress.     Breath sounds: Normal breath sounds. No wheezing.  Skin:    General: Skin is warm and dry.  Neurological:     Mental Status: She is alert and oriented to person, place, and time.  Psychiatric:        Behavior: Behavior normal.        Thought Content: Thought content normal.        Judgment: Judgment normal.            Assessment & Plan:  Anxiety/Depression- fair control off of zoloft. We discussed another medication without associated weight gain versus remaining off of medication. She wishes to remain off of meds for now. I have advised her to let me know if mood anxiety worsens or does not improve in the coming months.  Weight gain- TSH was normal last visit. Encouraged her to continue exercise and healthy diet.

## 2018-06-04 NOTE — Patient Instructions (Signed)
Please let me know if irritability/mood swings worsen or if they do not continue to improve.

## 2018-09-01 ENCOUNTER — Telehealth (INDEPENDENT_AMBULATORY_CARE_PROVIDER_SITE_OTHER): Payer: BC Managed Care – PPO | Admitting: Family

## 2018-09-01 ENCOUNTER — Other Ambulatory Visit: Payer: Self-pay

## 2018-09-01 DIAGNOSIS — F419 Anxiety disorder, unspecified: Secondary | ICD-10-CM | POA: Diagnosis not present

## 2018-09-01 DIAGNOSIS — F329 Major depressive disorder, single episode, unspecified: Secondary | ICD-10-CM | POA: Diagnosis not present

## 2018-09-01 DIAGNOSIS — J45909 Unspecified asthma, uncomplicated: Secondary | ICD-10-CM

## 2018-09-01 NOTE — Progress Notes (Signed)
Virtual Visit via Video Note  I connected with@ on 09/01/18 at  8:20 AM EDT by a video enabled telemedicine application and verified that I am speaking with the correct person using two identifiers. This visit type was conducted due to national recommendations for restrictions regarding the COVID-19 Pandemic (e.g. social distancing).  This format is felt to be most appropriate for this patient at this time.   I discussed the limitations of evaluation and management by telemedicine and the availability of in person appointments. The patient expressed understanding and agreed to proceed.  Only the patient and myself were on today's video visit. The patient was at home and I was in my office at the time of today's visit.   History of Present Illness:  Patient is a 23 yr old female who presents today for routine follow up.  Depression/anxiety- She stopped zoloft over the winter due to weight gain. Reports that her mood is currently stable off of the zoloft. Feels like she is doing "as well as can be expected" given the circumstances of the Covid Pandemic. She continues to look for a job which "is a bummer."    Reports that her most recent weight 151 lbs.  Wt Readings from Last 3 Encounters:  06/04/18 152 lb (68.9 kg)  05/07/18 149 lb (67.6 kg)  01/29/18 131 lb 12.8 oz (59.8 kg)   Asthma- reports that she is using albuterol about 2-3 days a week.  Not currently needing to use qvar     Observations/Objective:   Gen: Awake, alert, no acute distress Resp: Breathing is even and non-labored Psych: calm/pleasant demeanor Neuro: Alert and Oriented x 3, + facial symmetry, speech is clear. GAD 7 : Generalized Anxiety Score 09/01/2018 01/29/2018 01/01/2018  Nervous, Anxious, on Edge 0 0 2  Control/stop worrying 0 0 1  Worry too much - different things 0 0 2  Trouble relaxing 1 1 2   Restless 1 0 1  Easily annoyed or irritable 0 1 3  Afraid - awful might happen 0 0 1  Total GAD 7 Score 2 2 12    Anxiety Difficulty Not difficult at all Somewhat difficult Somewhat difficult   Depression screen [facility] 2/9 09/01/2018 06/04/2018 01/29/2018 01/01/2018 06/05/2017  Decreased Interest 0 1 2 2 1   Down, Depressed, Hopeless 1 0 1 2 -  PHQ - 2 Score 1 1 3 4 1   Altered sleeping 1 2 1 2  0  Tired, decreased energy 0 1 1 2 1   Change in appetite 0 1 0 1 1  Feeling bad or failure about yourself  1 0 1 3 1   Trouble concentrating 0 1 1 1  0  Moving slowly or fidgety/restless 0 0 2 2 1   Suicidal thoughts 0 0 0 1 0  PHQ-9 Score 3 6 9 16 5   Difficult doing work/chores Not difficult at all Somewhat difficult Somewhat difficult Somewhat difficult -     Assessment and Plan:  Anxiety/depression- stable off of zoloft. Continue to monitor. Her weight has stabilized since she discontinued.   Asthma- stable on prn albuterol. Advised pt that if she begins to need her albuterol on her daily basis then she should restart qvar.  Follow Up Instructions:  Plan follow up in 3 months for a cpx.    I discussed the assessment and treatment plan with the patient. The patient was provided an opportunity to ask questions and all were answered. The patient agreed with the plan and demonstrated an understanding of the instructions.  The patient was advised to call back or seek an in-person evaluation if the symptoms worsen or if the condition fails to improve as anticipated.    Nance Pear, NP

## 2018-11-22 ENCOUNTER — Encounter: Payer: BC Managed Care – PPO | Admitting: Family

## 2018-12-10 ENCOUNTER — Other Ambulatory Visit: Payer: Self-pay

## 2018-12-10 ENCOUNTER — Encounter: Payer: Self-pay | Admitting: Family

## 2018-12-10 ENCOUNTER — Ambulatory Visit (INDEPENDENT_AMBULATORY_CARE_PROVIDER_SITE_OTHER): Payer: BC Managed Care – PPO | Admitting: Family

## 2018-12-10 VITALS — BP 124/73 | HR 80 | Temp 98.5°F | Resp 16 | Ht 67.0 in | Wt 152.2 lb

## 2018-12-10 DIAGNOSIS — Z0000 Encounter for general adult medical examination without abnormal findings: Secondary | ICD-10-CM | POA: Diagnosis not present

## 2018-12-10 LAB — HEPATIC FUNCTION PANEL
ALT: 19 U/L (ref 0–35)
AST: 16 U/L (ref 0–37)
Albumin: 4.5 g/dL (ref 3.5–5.2)
Alkaline Phosphatase: 43 U/L (ref 39–117)
Bilirubin, Direct: 0.1 mg/dL (ref 0.0–0.3)
Total Bilirubin: 0.5 mg/dL (ref 0.2–1.2)
Total Protein: 7 g/dL (ref 6.0–8.3)

## 2018-12-10 LAB — BASIC METABOLIC PANEL
BUN: 12 mg/dL (ref 6–23)
CO2: 23 mEq/L (ref 19–32)
Calcium: 9.5 mg/dL (ref 8.4–10.5)
Chloride: 105 mEq/L (ref 96–112)
Creatinine, Ser: 0.8 mg/dL (ref 0.40–1.20)
GFR: 87.25 mL/min (ref >60.00)
Glucose, Bld: 87 mg/dL (ref 70–99)
Potassium: 4.7 mEq/L (ref 3.5–5.1)
Sodium: 138 mEq/L (ref 135–145)

## 2018-12-10 LAB — LIPID PANEL
Cholesterol: 228 mg/dL — ABNORMAL HIGH (ref 0–200)
HDL: 66.1 mg/dL (ref >39.00)
LDL Cholesterol: 148 mg/dL — ABNORMAL HIGH (ref 0–99)
NonHDL: 162.35
Total CHOL/HDL Ratio: 3
Triglycerides: 72 mg/dL (ref 0.0–149.0)
VLDL: 14.4 mg/dL (ref 0.0–40.0)

## 2018-12-10 LAB — CBC WITH DIFFERENTIAL/PLATELET
Basophils Absolute: 0 10*3/uL (ref 0.0–0.1)
Basophils Relative: 0.5 % (ref 0.0–3.0)
Eosinophils Absolute: 0 10*3/uL (ref 0.0–0.7)
Eosinophils Relative: 0.6 % (ref 0.0–5.0)
HCT: 39.6 % (ref 36.0–46.0)
Hemoglobin: 13.1 g/dL (ref 12.0–15.0)
Lymphocytes Relative: 26.1 % (ref 12.0–46.0)
Lymphs Abs: 1.7 10*3/uL (ref 0.7–4.0)
MCHC: 33.2 g/dL (ref 30.0–36.0)
MCV: 89.4 fl (ref 78.0–100.0)
Monocytes Absolute: 0.4 10*3/uL (ref 0.1–1.0)
Monocytes Relative: 5.4 % (ref 3.0–12.0)
Neutro Abs: 4.5 10*3/uL (ref 1.4–7.7)
Neutrophils Relative %: 67.4 % (ref 43.0–77.0)
Platelets: 309 10*3/uL (ref 150.0–400.0)
RBC: 4.43 Mil/uL (ref 3.87–5.11)
RDW: 12.5 % (ref 11.5–15.5)
WBC: 6.7 10*3/uL (ref 4.0–10.5)

## 2018-12-10 LAB — TSH: TSH: 2.01 u[IU]/mL (ref 0.35–4.50)

## 2018-12-10 NOTE — Patient Instructions (Signed)
Preventive Care 21-25 Years Old, Female Preventive care refers to visits with your health care provider and lifestyle choices that can promote health and wellness. This includes:  A yearly physical exam. This may also be called an annual well check.  Regular dental visits and eye exams.  Immunizations.  Screening for certain conditions.  Healthy lifestyle choices, such as eating a healthy diet, getting regular exercise, not using drugs or products that contain nicotine and tobacco, and limiting alcohol use. What can I expect for my preventive care visit? Physical exam Your health care provider will check your:  Height and weight. This may be used to calculate body mass index (BMI), which tells if you are at a healthy weight.  Heart rate and blood pressure.  Skin for abnormal spots. Counseling Your health care provider may ask you questions about your:  Alcohol, tobacco, and drug use.  Emotional well-being.  Home and relationship well-being.  Sexual activity.  Eating habits.  Work and work environment.  Method of birth control.  Menstrual cycle.  Pregnancy history. What immunizations do I need?  Influenza (flu) vaccine  This is recommended every year. Tetanus, diphtheria, and pertussis (Tdap) vaccine  You may need a Td booster every 10 years. Varicella (chickenpox) vaccine  You may need this if you have not been vaccinated. Human papillomavirus (HPV) vaccine  If recommended by your health care provider, you may need three doses over 6 months. Measles, mumps, and rubella (MMR) vaccine  You may need at least one dose of MMR. You may also need a second dose. Meningococcal conjugate (MenACWY) vaccine  One dose is recommended if you are age 19-21 years and a first-year college student living in a residence hall, or if you have one of several medical conditions. You may also need additional booster doses. Pneumococcal conjugate (PCV13) vaccine  You may need  this if you have certain conditions and were not previously vaccinated. Pneumococcal polysaccharide (PPSV23) vaccine  You may need one or two doses if you smoke cigarettes or if you have certain conditions. Hepatitis A vaccine  You may need this if you have certain conditions or if you travel or work in places where you may be exposed to hepatitis A. Hepatitis B vaccine  You may need this if you have certain conditions or if you travel or work in places where you may be exposed to hepatitis B. Haemophilus influenzae type b (Hib) vaccine  You may need this if you have certain conditions. You may receive vaccines as individual doses or as more than one vaccine together in one shot (combination vaccines). Talk with your health care provider about the risks and benefits of combination vaccines. What tests do I need?  Blood tests  Lipid and cholesterol levels. These may be checked every 5 years starting at age 20.  Hepatitis C test.  Hepatitis B test. Screening  Diabetes screening. This is done by checking your blood sugar (glucose) after you have not eaten for a while (fasting).  Sexually transmitted disease (STD) testing.  BRCA-related cancer screening. This may be done if you have a family history of breast, ovarian, tubal, or peritoneal cancers.  Pelvic exam and Pap test. This may be done every 3 years starting at age 21. Starting at age 30, this may be done every 5 years if you have a Pap test in combination with an HPV test. Talk with your health care provider about your test results, treatment options, and if necessary, the need for more tests.   Follow these instructions at home: Eating and drinking   Eat a diet that includes fresh fruits and vegetables, whole grains, lean protein, and low-fat dairy.  Take vitamin and mineral supplements as recommended by your health care provider.  Do not drink alcohol if: ? Your health care provider tells you not to drink. ? You are  pregnant, may be pregnant, or are planning to become pregnant.  If you drink alcohol: ? Limit how much you have to 0-1 drink a day. ? Be aware of how much alcohol is in your drink. In the [place] one drink equals one 12 oz bottle of beer (355 mL), one 5 oz glass of wine (148 mL), or one 1 oz glass of hard liquor (44 mL). Lifestyle  Take daily care of your teeth and gums.  Stay active. Exercise for at least 30 minutes on 5 or more days each week.  Do not use any products that contain nicotine or tobacco, such as cigarettes, e-cigarettes, and chewing tobacco. If you need help quitting, ask your health care provider.  If you are sexually active, practice safe sex. Use a condom or other form of birth control (contraception) in order to prevent pregnancy and STIs (sexually transmitted infections). If you plan to become pregnant, see your health care provider for a preconception visit. What's next?  Visit your health care provider once a year for a well check visit.  Ask your health care provider how often you should have your eyes and teeth checked.  Stay up to date on all vaccines. This information is not intended to replace advice given to you by your health care provider. Make sure you discuss any questions you have with your health care provider. Document Released: 06/17/2001 Document Revised: 12/31/2017 Document Reviewed: 12/31/2017 Elsevier Patient Education  2020 Elsevier Inc.  

## 2018-12-10 NOTE — Progress Notes (Signed)
Subjective:    Patient ID: Melanie Drake, female    DOB: 1995-10-13, 23 y.o.   MRN: [id number]  HPI  Patient presents today for complete physical.  Immunizations: tetanus 2016 Diet:healthy diet Wt Readings from Last 3 Encounters:  12/10/18 152 lb 3.2 oz (69 kg)  06/04/18 152 lb (68.9 kg)  05/07/18 149 lb (67.6 kg)  Exercise:  Reports some exercise Pap Smear: 2018 Dental:  Up to date Vision: up to date    Review of Systems  Constitutional: Negative for unexpected weight change.  HENT: Negative for hearing loss and rhinorrhea.   Eyes: Negative for visual disturbance.  Respiratory: Negative for cough and shortness of breath.   Cardiovascular: Negative for chest pain and leg swelling.  Gastrointestinal: Negative for blood in stool, constipation and diarrhea.  Genitourinary: Negative for dysuria, frequency and hematuria.  Musculoskeletal: Negative for arthralgias and myalgias.  Skin: Negative for rash.  Neurological: Negative for headaches.  Hematological: Negative for adenopathy.  Psychiatric/Behavioral:       Denies anxiety/depression symptoms   Past Medical History:  Diagnosis Date  . Asthma 05/2003   dx. by ped. doc.  . Seasonal allergies      Social History   Socioeconomic History  . Marital status: Single    Spouse name: Not on file  . Number of children: Not on file  . Years of education: Not on file  . Highest education level: Not on file  Occupational History  . Occupation: Librarian, academicLegal assistant  Social Needs  . Financial resource strain: Not hard at all  . Food insecurity    Worry: Never true    Inability: Never true  . Transportation needs    Medical: No    Non-medical: No  Tobacco Use  . Smoking status: Never Smoker  . Smokeless tobacco: Never Used  Substance and Sexual Activity  . Alcohol use: No  . Drug use: No  . Sexual activity: Never  Lifestyle  . Physical activity    Days per week: 0 days    Minutes per session: Not on file  . Stress: To  some extent  Relationships  . Social connections    Talks on phone: More than three times a week    Gets together: Twice a week    Attends religious service: More than 4 times per year    Active member of club or organization: No    Attends meetings of clubs or organizations: Never    Relationship status: Not on file  . Intimate partner violence    Fear of current or ex partner: Not on file    Emotionally abused: Not on file    Physically abused: Not on file    Forced sexual activity: Not on file  Other Topics Concern  . 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  Family History  Problem Relation Age of Onset  . Hypertension Mother   . Hypothyroidism Mother   . Multiple sclerosis Mother   . Hyperlipidemia Father   . Bladder Cancer Maternal Grandfather   . Diabetes Neg Hx   . Kidney disease Neg Hx     Allergies  Allergen Reactions  . Septra [Bactrim] Rash    Current Outpatient Medications on File Prior to Visit  Medication Sig Dispense Refill  .  albuterol (PROAIR HFA) 108 (90 Base) MCG/ACT inhaler Inhale 2 puffs into the lungs every 6 (six) hours as needed for wheezing or shortness of breath. 1 Inhaler 5  . beclomethasone (QVAR) 40 MCG/ACT inhaler USE 1 PUFF TWICE DAILY. 8.7 g 5  . JUNEL FE 24 1-20 MG-MCG(24) tablet      No current facility-administered medications on file prior to visit.     BP 124/73   Pulse 80   Temp 98.5 F (36.9 C) (Oral)   Resp 16   Ht 5\' 7"  (1.702 m)   Wt 152 lb 3.2 oz (69 kg)   SpO2 100%   BMI 23.84 kg/m       Objective:   Physical Exam  Physical Exam  Constitutional: She is oriented to person, place, and time. She appears well-developed and well-nourished. No distress.  HENT:  Head: Normocephalic and atraumatic.  Right Ear: Tympanic membrane and ear canal normal.  Left  Ear: Tympanic membrane and ear canal normal.  Mouth/Throat: Not examined. Wearing mask Eyes: Pupils are equal, round, and reactive to light. No scleral icterus.  Neck: Normal range of motion. No thyromegaly present.  Cardiovascular: Normal rate and regular rhythm.   No murmur heard. Pulmonary/Chest: Effort normal and breath sounds normal. No respiratory distress. He has no wheezes. She has no rales. She exhibits no tenderness.  Abdominal: Soft. Bowel sounds are normal. She exhibits no distension and no mass. There is no tenderness. There is no rebound and no guarding.  Musculoskeletal: She exhibits no edema.  Lymphadenopathy:    She has no cervical adenopathy.  Neurological: She is alert and oriented to person, place, and time. She has normal patellar reflexes. She exhibits normal muscle tone. Coordination normal.  Skin: Skin is warm and dry.  Psychiatric: She has a normal mood and affect. Her behavior is normal. Judgment and thought content normal.  Breast/pelvic: deferred            Assessment & Plan:  Preventative care- encouraged pt to continue healthy diet and exercise. Pap up to date. Obtain routine lab work.  Immunizations reviewed and up to date.        Assessment & Plan:

## 2018-12-13 ENCOUNTER — Other Ambulatory Visit: Payer: Self-pay | Admitting: Family

## 2019-05-02 ENCOUNTER — Other Ambulatory Visit: Payer: Self-pay | Admitting: Family

## 2019-08-12 ENCOUNTER — Encounter: Payer: Self-pay | Admitting: Family

## 2019-08-12 ENCOUNTER — Other Ambulatory Visit: Payer: Self-pay | Admitting: Family

## 2019-08-12 ENCOUNTER — Other Ambulatory Visit: Payer: Self-pay

## 2019-08-12 MED ORDER — QVAR 40 MCG/ACT IN AERS: INHALATION_SPRAY | RESPIRATORY_TRACT | 5 refills | Status: DC

## 2019-12-16 ENCOUNTER — Encounter: Payer: BC Managed Care – PPO | Admitting: Family

## 2019-12-26 ENCOUNTER — Encounter: Payer: Self-pay | Admitting: Family

## 2019-12-26 ENCOUNTER — Ambulatory Visit (INDEPENDENT_AMBULATORY_CARE_PROVIDER_SITE_OTHER): Payer: 59 | Admitting: Family

## 2019-12-26 ENCOUNTER — Other Ambulatory Visit: Payer: Self-pay

## 2019-12-26 VITALS — BP 133/76 | HR 89 | Temp 98.6°F | Resp 16 | Ht 66.0 in | Wt 157.0 lb

## 2019-12-26 DIAGNOSIS — F329 Major depressive disorder, single episode, unspecified: Secondary | ICD-10-CM | POA: Diagnosis not present

## 2019-12-26 DIAGNOSIS — J45909 Unspecified asthma, uncomplicated: Secondary | ICD-10-CM

## 2019-12-26 DIAGNOSIS — Z0000 Encounter for general adult medical examination without abnormal findings: Secondary | ICD-10-CM

## 2019-12-26 DIAGNOSIS — H6123 Impacted cerumen, bilateral: Secondary | ICD-10-CM | POA: Diagnosis not present

## 2019-12-26 DIAGNOSIS — F32A Depression, unspecified: Secondary | ICD-10-CM

## 2019-12-26 LAB — CBC WITH DIFFERENTIAL/PLATELET
Basophils Absolute: 0 10*3/uL (ref 0.0–0.1)
Basophils Relative: 0.4 % (ref 0.0–3.0)
Eosinophils Absolute: 0 10*3/uL (ref 0.0–0.7)
Eosinophils Relative: 0.5 % (ref 0.0–5.0)
HCT: 41.7 % (ref 36.0–46.0)
Hemoglobin: 14 g/dL (ref 12.0–15.0)
Lymphocytes Relative: 24.5 % (ref 12.0–46.0)
Lymphs Abs: 2.1 10*3/uL (ref 0.7–4.0)
MCHC: 33.6 g/dL (ref 30.0–36.0)
MCV: 89.7 fl (ref 78.0–100.0)
Monocytes Absolute: 0.3 10*3/uL (ref 0.1–1.0)
Monocytes Relative: 3.9 % (ref 3.0–12.0)
Neutro Abs: 6 10*3/uL (ref 1.4–7.7)
Neutrophils Relative %: 70.7 % (ref 43.0–77.0)
Platelets: 353 10*3/uL (ref 150.0–400.0)
RBC: 4.65 Mil/uL (ref 3.87–5.11)
RDW: 12.6 % (ref 11.5–15.5)
WBC: 8.5 10*3/uL (ref 4.0–10.5)

## 2019-12-26 LAB — BASIC METABOLIC PANEL
BUN: 6 mg/dL (ref 6–23)
CO2: 25 mEq/L (ref 19–32)
Calcium: 9.5 mg/dL (ref 8.4–10.5)
Chloride: 105 mEq/L (ref 96–112)
Creatinine, Ser: 0.84 mg/dL (ref 0.40–1.20)
GFR: 81.8 mL/min (ref >60.00)
Glucose, Bld: 93 mg/dL (ref 70–99)
Potassium: 4.7 mEq/L (ref 3.5–5.1)
Sodium: 139 mEq/L (ref 135–145)

## 2019-12-26 LAB — HEPATIC FUNCTION PANEL
ALT: 16 U/L (ref 0–35)
AST: 16 U/L (ref 0–37)
Albumin: 4.4 g/dL (ref 3.5–5.2)
Alkaline Phosphatase: 37 U/L — ABNORMAL LOW (ref 39–117)
Bilirubin, Direct: 0.1 mg/dL (ref 0.0–0.3)
Total Bilirubin: 0.5 mg/dL (ref 0.2–1.2)
Total Protein: 7.1 g/dL (ref 6.0–8.3)

## 2019-12-26 LAB — LIPID PANEL
Cholesterol: 238 mg/dL — ABNORMAL HIGH (ref 0–200)
HDL: 67.2 mg/dL (ref >39.00)
LDL Cholesterol: 149 mg/dL — ABNORMAL HIGH (ref 0–99)
NonHDL: 170.8
Total CHOL/HDL Ratio: 4
Triglycerides: 107 mg/dL (ref 0.0–149.0)
VLDL: 21.4 mg/dL (ref 0.0–40.0)

## 2019-12-26 LAB — TSH: TSH: 2.19 u[IU]/mL (ref 0.35–4.50)

## 2019-12-26 MED ORDER — FLUTICASONE PROPIONATE HFA 44 MCG/ACT IN AERO: 1.0000 | INHALATION_SPRAY | Freq: Two times a day (BID) | RESPIRATORY_TRACT | 11 refills | Status: DC

## 2019-12-26 NOTE — Progress Notes (Signed)
Subjective:    Patient ID: Melanie Drake, female    DOB: July 26, 1995, 24 y.o.   MRN: [id number]  HPI  Patient presents today for complete physical.  Immunizations:  Completed pfizer in February.  Diet: healthy Exercise: as much as possible, some weights at home  Pap Smear: scheduled for 10/20- [facility] Vision: up to date Dental: up to date Wt Readings from Last 3 Encounters:  12/26/19 157 lb (71.2 kg)  12/10/18 152 lb 3.2 oz (69 kg)  06/04/18 152 lb (68.9 kg)   Insomnia- zquil helps.  Melatonin- falls asleep.    Reports some trouble hearing. Wonders if she has wax build up.  Asthma- stable but qvar is too costly.   Review of Systems  Constitutional: Negative for unexpected weight change.  HENT: Negative for hearing loss and rhinorrhea.   Eyes: Negative for visual disturbance.  Respiratory: Negative for cough and shortness of breath.   Cardiovascular: Negative for chest pain.  Gastrointestinal: Negative for blood in stool, constipation, diarrhea and nausea.  Genitourinary: Negative for dysuria, frequency, hematuria and menstrual problem.  Musculoskeletal: Negative for arthralgias and myalgias.  Skin: Negative for rash.  Neurological: Negative for headaches.  Hematological: Negative for adenopathy.  Psychiatric/Behavioral:       Denies depression/anxiety   Past Medical History:  Diagnosis Date  . Asthma 05/2003   dx. by ped. doc.  . Seasonal allergies      Social History   Socioeconomic History  . Marital status: Single    Spouse name: Not on file  . Number of children: Not on file  . Years of education: Not on file  . Highest education level: Not on file  Occupational History  . Occupation: Librarian, academic  Tobacco Use  . Smoking status: Never Smoker  . Smokeless tobacco: Never Used  Vaping Use  . Vaping Use: Never used  Substance and Sexual Activity  . Alcohol use: No  . Drug use: No  . Sexual activity: Never  Other Topics Concern  . 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 Difficulty of Paying Living Expenses: Not on file  Food Insecurity:   . Worried About Programme researcher, broadcasting/film/video in the Last Year: Not on file  . Ran Out of Food in the Last Year: Not on file  Transportation Needs:   . Lack of Transportation (Medical): Not on file  . Lack of Transportation (Non-Medical): Not on file  Physical Activity:   . Days of Exercise per Week: Not on file  . Minutes of Exercise per Session: Not on file  Stress:   . Feeling of Stress : Not on file  Social Connections:   . Frequency of Communication with Friends and Family: Not on file  . Frequency of Social Gatherings with Friends and Family: Not on file  . Attends Religious Services: Not on file  . Active Member of Clubs or Organizations: Not on file  . Attends Banker Meetings: Not on file  . Marital Status: Not on file  Intimate Partner Violence:   . Fear of Current or Ex-Partner: Not on file  . Emotionally Abused: Not on file  . Physically Abused: Not on file  . Sexually Abused: Not on file    No past  surgical history on file.  Family History  Problem Relation Age of Onset  . Hypertension Mother   . Hypothyroidism Mother   . Multiple sclerosis Mother   . Hyperlipidemia Father   . Bladder Cancer Maternal Grandfather   . Diabetes Neg Hx   . Kidney disease Neg Hx     Allergies  Allergen Reactions  . Septra [Bactrim] Rash    Current Outpatient Medications on File Prior to Visit  Medication Sig Dispense Refill  . albuterol (VENTOLIN HFA) 108 (90 Base) MCG/ACT inhaler USE 2 PUFFS EVERY 6 HOURS AS NEEDED FOR SHORTNESS OF BREATH AND WHEEZING. 18 g 0  . beclomethasone (QVAR) 40 MCG/ACT inhaler USE 1 PUFF TWICE DAILY. 8.7 g 5  . JUNEL FE 24 1-20 MG-MCG(24) tablet       No current facility-administered medications on file prior to visit.    BP 133/76 (BP Location: Left Arm, Patient Position: Sitting, Cuff Size: Small)   Pulse 89   Temp 98.6 F (37 C) (Oral)   Resp 16   Ht 5\' 6"  (1.676 m)   Wt 157 lb (71.2 kg)   SpO2 100%   BMI 25.34 kg/m       Objective:   Physical Exam  Physical Exam  Constitutional: She is oriented to person, place, and time. She appears well-developed and well-nourished. No distress.  HENT:  Head: Normocephalic and atraumatic.  Right Ear: cerumen impaction Left Ear: cerumen impaction Mouth/Throat: Oropharynx is clear and moist.  Eyes: Pupils are equal, round, and reactive to light. No scleral icterus.  Neck: Normal range of motion. No thyromegaly present.  Cardiovascular: Normal rate and regular rhythm.   No murmur heard. Pulmonary/Chest: Effort normal and breath sounds normal. No respiratory distress. He has no wheezes. She has no rales. She exhibits no tenderness.  Abdominal: Soft. Bowel sounds are normal. She exhibits no distension and no mass. There is no tenderness. There is no rebound and no guarding.  Musculoskeletal: She exhibits no edema.  Lymphadenopathy:    She has no cervical adenopathy.  Neurological: She is alert and oriented to person, place, and time. She has normal patellar reflexes. She exhibits normal muscle tone. Coordination normal.  Skin: Skin is warm and dry.  Psychiatric: She has a normal mood and affect. Her behavior is normal. Judgment and thought content normal.  Breast/pelvic: deferred to GYN        Assessment & Plan:   Preventative care- discussed low cholesterol diet, exercise goal of 30 minutes of cardio 5 days a week.  Pap per gyn.  Ceruminosis-Ceruminosis is noted.  Wax is removed by syringing and manual debridement. Instructions for home care to prevent wax buildup are given.  Depression- notes some difficulty motivating some in the AM.  Scored 13 on phq-9 today. Recommended  counseling and 6 month follow up with me. If symptoms worsen she should come back and see me sooner.  Asthma- qvar is too costly. Will switch to flovent.       Assessment & Plan:

## 2019-12-26 NOTE — Patient Instructions (Addendum)
Please call [facility] to schedule an appointment with a counselor. [phone]

## 2019-12-27 MED ORDER — QVAR 40 MCG/ACT IN AERS: 1.0000 | INHALATION_SPRAY | Freq: Two times a day (BID) | RESPIRATORY_TRACT | 5 refills | Status: DC

## 2019-12-27 MED ORDER — ALBUTEROL SULFATE HFA 108 (90 BASE) MCG/ACT IN AERS: INHALATION_SPRAY | RESPIRATORY_TRACT | 5 refills | Status: DC

## 2019-12-27 NOTE — Addendum Note (Signed)
Addended byConrad Dushore D on: 12/27/2019 10:47 AM   Modules accepted: Orders

## 2020-01-06 ENCOUNTER — Encounter: Payer: Self-pay | Admitting: Family

## 2020-03-07 LAB — HM PAP SMEAR: HM Pap smear: NEGATIVE

## 2020-05-15 ENCOUNTER — Encounter: Payer: Self-pay | Admitting: Family

## 2020-05-21 ENCOUNTER — Telehealth: Payer: 59 | Admitting: Family

## 2020-05-22 ENCOUNTER — Other Ambulatory Visit: Payer: Self-pay

## 2020-05-22 ENCOUNTER — Encounter: Payer: Self-pay | Admitting: Family

## 2020-05-22 ENCOUNTER — Telehealth (INDEPENDENT_AMBULATORY_CARE_PROVIDER_SITE_OTHER): Payer: 59 | Admitting: Family

## 2020-05-22 DIAGNOSIS — F419 Anxiety disorder, unspecified: Secondary | ICD-10-CM | POA: Diagnosis not present

## 2020-05-22 MED ORDER — SERTRALINE HCL 50 MG PO TABS: ORAL_TABLET | ORAL | 0 refills | Status: DC

## 2020-05-22 NOTE — Progress Notes (Signed)
Virtual Visit via Video Note  I connected with Melanie Drake on 05/22/20 at  5:40 PM EST by a video enabled telemedicine application and verified that I am speaking with the correct person using two identifiers.  Location: Patient: home Provider: home   I discussed the limitations of evaluation and management by telemedicine and the availability of in person appointments. The patient expressed understanding and agreed to proceed. Only the patient and myself were present for today's video call.   History of Present Illness:  Patient is a 25 yr old female who presents today to discuss work stress and anxiety.  She reports that she started a new EMR at work which has been stressful.  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 States that she has been having near panic attacks. Waking up in the AM and not wanting to go to work. Last week she found some old zoloft in her cabinet and started taking 1/2 tab daily and thinks that this was helpful.    Observations/Objective:   Gen: Awake, alert, no acute distress Resp: Breathing is even and non-labored Psych: calm/pleasant demeanor Neuro: Alert and Oriented x 3, + facial symmetry, speech is clear.   Assessment and Plan:  Anxiety- uncontrolled. Will rx with zoloft 50mg  1/2 tab once daily for 1 week, then increase to a full tab once daily on week two.  She will follow back up in February as scheduled.   Follow Up Instructions:    I discussed the assessment and treatment plan with the patient. The patient was provided an opportunity to ask questions and all were answered. The patient agreed with the plan and demonstrated an understanding of the instructions.   The patient was advised to call back or seek an in-person evaluation if the symptoms worsen or if the condition fails to improve as anticipated.  March, NP

## 2020-06-27 ENCOUNTER — Ambulatory Visit (INDEPENDENT_AMBULATORY_CARE_PROVIDER_SITE_OTHER): Payer: 59 | Admitting: Family

## 2020-06-27 ENCOUNTER — Other Ambulatory Visit: Payer: Self-pay

## 2020-06-27 ENCOUNTER — Encounter: Payer: Self-pay | Admitting: Family

## 2020-06-27 VITALS — BP 118/72 | HR 77 | Temp 98.7°F | Resp 16 | Ht 66.0 in | Wt 158.6 lb

## 2020-06-27 DIAGNOSIS — F419 Anxiety disorder, unspecified: Secondary | ICD-10-CM

## 2020-06-27 MED ORDER — SERTRALINE HCL 50 MG PO TABS: 50.0000 mg | ORAL_TABLET | Freq: Every day | ORAL | 1 refills | Status: DC

## 2020-06-27 NOTE — Progress Notes (Signed)
Subjective:    Patient ID: Melanie Drake, female    DOB: 1995/10/29, 25 y.o.   MRN: [id number]  HPI   Patient is a 25 yr old female who presents today for follow up. We last saw her on 05/22/20 and she described a lot of work stress and near panic attacks. She was having trouble getting out of bed in the Am's to go to work.  Last visit we initiated zoloft 50mg .   States that she is no longer having panic attacks and can get out of bed without difficult. Sleeping well. Denies side effects.  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   Review of Systems    see HPI  Past Medical History:  Diagnosis Date  . Asthma 05/2003   dx. by ped. doc.  . Seasonal allergies      Social History   Socioeconomic History  . Marital status: Single    Spouse name: Not on file  . Number of children: Not on file  . Years of education: Not on file  . Highest education level: Not on file  Occupational History  . 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 Smoking status: Never Smoker  . Smokeless tobacco: Never Used  Vaping Use  . Vaping Use: Never used  Substance and Sexual Activity  . Alcohol use: No  . Drug use: No  . Sexual activity: Never  Other Topics Concern  . 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  Family History  Problem Relation Age of Onset  . Hypertension Mother   . Hypothyroidism Mother   . Multiple sclerosis Mother   . Hyperlipidemia Father   . Bladder Cancer Maternal Grandfather   . Diabetes Neg Hx   . Kidney  disease Neg Hx     Allergies  Allergen Reactions  . Septra [Bactrim] Rash    Current Outpatient Medications on File Prior to Visit  Medication Sig Dispense Refill  . albuterol (VENTOLIN HFA) 108 (90 Base) MCG/ACT inhaler USE 2 PUFFS EVERY 6 HOURS AS NEEDED FOR SHORTNESS OF BREATH AND WHEEZING. 18 g 5  . beclomethasone (QVAR) 40 MCG/ACT inhaler Inhale 1 puff into the lungs in the morning and at bedtime. 1 each 5  . sertraline (ZOLOFT) 50 MG tablet 1/2 tab by mouth once daily for 1 week, then increase to a full tab once daily on week two 30 tablet 0   No current facility-administered medications on file prior to visit.    BP 118/72 (BP Location: Left Arm, Patient Position: Sitting, Cuff Size: Small)   Pulse 77   Temp 98.7 F (37.1 C) (Oral)   Resp 16   Ht 5\' 6"  (1.676 m)   Wt 158 lb 9.6 oz (71.9 kg)   SpO2 100%   BMI 25.60 kg/m    Objective:   Physical Exam Constitutional:      Appearance: She is well-developed and well-nourished.  Neck:     Thyroid: No  thyromegaly.  Neurological:     Mental Status: She is alert and oriented to person, place, and time.  Psychiatric:        Attention and Perception: Attention normal.        Mood and Affect: Mood and affect and mood normal.        Behavior: Behavior normal.        Thought Content: Thought content normal.        Cognition and Memory: Cognition normal.        Judgment: Judgment normal.           Assessment & Plan:  Anxiety- improved on zoloft 50mg  once daily. She is tolerating without side effect. Will continue same. Advised pt to reach out to me if she has any recurrent anxiety issues- otherwise we will plan to bring her back in 6 months.   This visit occurred during the SARS-CoV-2 public health emergency.  Safety protocols were in place, including screening questions prior to the visit, additional usage of staff PPE, and extensive cleaning of exam room while observing appropriate contact time as indicated for  disinfecting solutions.

## 2020-06-27 NOTE — Patient Instructions (Signed)
Please continue zoloft 50mg.  

## 2020-11-16 ENCOUNTER — Other Ambulatory Visit: Payer: Self-pay | Admitting: Family

## 2020-12-25 ENCOUNTER — Ambulatory Visit (INDEPENDENT_AMBULATORY_CARE_PROVIDER_SITE_OTHER): Payer: 59 | Admitting: Family

## 2020-12-25 ENCOUNTER — Other Ambulatory Visit: Payer: Self-pay

## 2020-12-25 ENCOUNTER — Encounter: Payer: Self-pay | Admitting: Family

## 2020-12-25 VITALS — BP 119/78 | HR 89 | Temp 98.5°F | Resp 16 | Ht 66.5 in | Wt 164.6 lb

## 2020-12-25 DIAGNOSIS — Z0000 Encounter for general adult medical examination without abnormal findings: Secondary | ICD-10-CM | POA: Diagnosis not present

## 2020-12-25 DIAGNOSIS — E785 Hyperlipidemia, unspecified: Secondary | ICD-10-CM | POA: Diagnosis not present

## 2020-12-25 LAB — COMPREHENSIVE METABOLIC PANEL
ALT: 14 U/L (ref 0–35)
AST: 16 U/L (ref 0–37)
Albumin: 4.3 g/dL (ref 3.5–5.2)
Alkaline Phosphatase: 54 U/L (ref 39–117)
BUN: 12 mg/dL (ref 6–23)
CO2: 26 mEq/L (ref 19–32)
Calcium: 9.3 mg/dL (ref 8.4–10.5)
Chloride: 105 mEq/L (ref 96–112)
Creatinine, Ser: 0.86 mg/dL (ref 0.40–1.20)
GFR: 92.69 mL/min (ref >60.00)
Glucose, Bld: 84 mg/dL (ref 70–99)
Potassium: 4.2 mEq/L (ref 3.5–5.1)
Sodium: 138 mEq/L (ref 135–145)
Total Bilirubin: 0.5 mg/dL (ref 0.2–1.2)
Total Protein: 7 g/dL (ref 6.0–8.3)

## 2020-12-25 LAB — LIPID PANEL
Cholesterol: 223 mg/dL — ABNORMAL HIGH (ref 0–200)
HDL: 75.8 mg/dL (ref >39.00)
LDL Cholesterol: 131 mg/dL — ABNORMAL HIGH (ref 0–99)
NonHDL: 147.03
Total CHOL/HDL Ratio: 3
Triglycerides: 82 mg/dL (ref 0.0–149.0)
VLDL: 16.4 mg/dL (ref 0.0–40.0)

## 2020-12-25 MED ORDER — ALBUTEROL SULFATE HFA 108 (90 BASE) MCG/ACT IN AERS: INHALATION_SPRAY | RESPIRATORY_TRACT | 5 refills | Status: DC

## 2020-12-25 MED ORDER — SERTRALINE HCL 50 MG PO TABS: 50.0000 mg | ORAL_TABLET | Freq: Every day | ORAL | 1 refills | Status: DC

## 2020-12-25 MED ORDER — QVAR REDIHALER 40 MCG/ACT IN AERB: 1.0000 | INHALATION_SPRAY | Freq: Two times a day (BID) | RESPIRATORY_TRACT | 5 refills | Status: DC

## 2020-12-25 NOTE — Assessment & Plan Note (Signed)
Discussed healthy diet and regular exercise. Obtain routine labs as ordered.  Immunizations reviewed and up to date.  Pap is up to date and she is followed by GYN.

## 2020-12-25 NOTE — Patient Instructions (Signed)
Please complete lab work prior to leaving.   

## 2020-12-25 NOTE — Progress Notes (Signed)
Subjective:     Patient ID: Melanie Drake, female    DOB: 1995/08/10, 25 y.o.   MRN: [id number]  Chief Complaint  Patient presents with   Annual Exam         HPI Patient is in today for cpx.  Patient presents today for complete physical.  Immunizations: Tdap 2016, pfizer x 3  Diet: healthy Exercise: started biking more, walking Wt Readings from Last 3 Encounters:  12/25/20 164 lb 9.6 oz (74.7 kg)  06/27/20 158 lb 9.6 oz (71.9 kg)  12/26/19 157 lb (71.2 kg)  Pap Smear: 2021- [facility]: due Dental: up to date  Anxiety- reports mood remains stable on zoloft 34m once daily.  Asthma- reports asthma is well controlled with her Qvar. Only uses albuterol prior to exercise.   Health Maintenance Due  Topic Date Due   HIV Screening  Never done   Hepatitis C Screening  Never done   INFLUENZA VACCINE  12/03/2020    Past Medical History:  Diagnosis Date   Asthma 05/2003   dx. by ped. doc.   Seasonal allergies     History reviewed. No pertinent surgical history.  Family History  Problem Relation Age of Onset   Hypertension Mother    Hypothyroidism Mother    Multiple sclerosis Mother    Hyperlipidemia Father    Bladder Cancer Maternal Grandfather    Diabetes Neg Hx    Kidney disease Neg Hx     Social History   Socioeconomic History   Marital status: Single    Spouse name: Not on file   Number of children: Not on file   Years of education: Not on file   Highest education level: Not on file  Occupational History   Occupation: LHerbalist Tobacco Use   Smoking status: Never   Smokeless tobacco: Never  Vaping Use   Vaping Use: Never used  Substance and Sexual Activity   Alcohol use: No   Drug use: No   Sexual activity: Never  Other Topics Concern   Not on file  Social History Narrative   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 1 each 5   albuterol (VENTOLIN HFA) 108 (90 Base) MCG/ACT inhaler USE 2 PUFFS EVERY 6 HOURS AS NEEDED FOR SHORTNESS OF BREATH AND WHEEZING. 18 g 5   sertraline (ZOLOFT) 50 MG tablet Take 1 tablet (50 mg total) by mouth daily. 90 tablet 1   No facility-administered medications prior to visit.    Allergies  Allergen Reactions   Septra [Bactrim] Rash    Review of Systems  HENT:  Positive for congestion.   Eyes:  Negative for blurred vision.  Respiratory:  Negative for cough.   Cardiovascular:  Negative for chest pain and leg swelling.  Gastrointestinal:  Negative for constipation and diarrhea.  Genitourinary:  Negative for dysuria, frequency and hematuria.  Musculoskeletal:  Negative for joint pain and myalgias.  Skin:  Negative for rash.  Neurological:  Negative for headaches.  Psychiatric/Behavioral:         Denies depression anxiety      Objective:    Physical Exam  BP 119/78 (BP Location: Left Arm, Patient Position: Sitting, Cuff Size: Small)   Pulse 89   Temp 98.5 F (36.9 C) (Oral)   Resp 16   Ht 5' 6.5" (1.689 m)   Wt 164 lb 9.6 oz (74.7 kg)   SpO2 100%   BMI 26.17 kg/m  Wt Readings from Last 3 Encounters:  12/25/20 164 lb 9.6 oz (74.7 kg)  06/27/20 158 lb 9.6 oz (71.9 kg)  12/26/19 157 lb (71.2 kg)   Physical Exam  Constitutional: She is oriented to person, place, and time. She appears well-developed and well-nourished. No distress.  HENT:  Head: Normocephalic and atraumatic.  Right Ear: Tympanic membrane and ear canal normal.  Left Ear: Tympanic  membrane and ear canal normal.  Mouth/Throat: not examined- pt wearing mask Eyes: Pupils are equal, round, and reactive to light. No scleral icterus.  Neck: Normal range of motion. No thyromegaly present.  Cardiovascular: Normal rate and regular rhythm.   No murmur heard. Pulmonary/Chest: Effort normal and breath sounds normal. No respiratory distress. He has no wheezes. She has no rales. She exhibits no tenderness.  Abdominal: Soft. Bowel sounds are normal. She exhibits no distension and no mass. There is no tenderness. There is no rebound and no guarding.  Musculoskeletal: She exhibits no edema.  Lymphadenopathy:    She has no cervical adenopathy.  Neurological: She is alert and oriented to person, place, and time. She has normal patellar reflexes. She exhibits normal muscle tone. Coordination normal.  Skin: Skin is warm and dry.  Psychiatric: She has a normal mood and affect. Her behavior is normal. Judgment and thought content normal.  Breast/pelvic: deferred           Assessment & Plan:       Assessment & Plan:   Problem List Items Addressed This Visit       Unprioritized   Preventative health care    Discussed healthy diet and regular exercise. Obtain routine labs as ordered.  Immunizations reviewed and up to date.  Pap is up to date and she is followed by GYN.       Other Visit Diagnoses     Hyperlipidemia, unspecified hyperlipidemia type    -  Primary   Relevant Orders   Lipid panel   Comp Met (CMET)       I am having Melanie Drake start on [place]. I am also having her maintain her Qvar, albuterol, and sertraline.  Meds ordered this encounter  Medications   albuterol (VENTOLIN HFA) 108 (90 Base) MCG/ACT inhaler    Sig: USE 2 PUFFS EVERY 6 HOURS AS NEEDED FOR SHORTNESS OF BREATH AND WHEEZING.    Dispense:  18 g    Refill:  5    This prescription was filled on 11/01/2020. Any refills authorized will be placed on file.    Order Specific Question:    Supervising Provider    Answer:   Penni Homans A [[id number]]   sertraline (ZOLOFT) 50 MG tablet    Sig: Take 1 tablet (50 mg total) by mouth daily.    Dispense:  90 tablet    Refill:  1    Order Specific Question:   Supervising Provider    Answer:   Penni Homans A [[id number]]   beclomethasone (QVAR  REDIHALER) 40 MCG/ACT inhaler    Sig: Inhale 1 puff into the lungs 2 (two) times daily.    Dispense:  1 each    Refill:  5    Order Specific Question:   Supervising Provider    Answer:   Penni Homans A [id number]

## 2021-05-13 DIAGNOSIS — J209 Acute bronchitis, unspecified: Secondary | ICD-10-CM | POA: Diagnosis not present

## 2021-05-13 DIAGNOSIS — J069 Acute upper respiratory infection, unspecified: Secondary | ICD-10-CM | POA: Diagnosis not present

## 2021-05-13 DIAGNOSIS — H6123 Impacted cerumen, bilateral: Secondary | ICD-10-CM | POA: Diagnosis not present

## 2021-05-13 DIAGNOSIS — J45909 Unspecified asthma, uncomplicated: Secondary | ICD-10-CM | POA: Diagnosis not present

## 2021-06-26 DIAGNOSIS — Z20828 Contact with and (suspected) exposure to other viral communicable diseases: Secondary | ICD-10-CM | POA: Diagnosis not present

## 2021-06-26 DIAGNOSIS — J019 Acute sinusitis, unspecified: Secondary | ICD-10-CM | POA: Diagnosis not present

## 2021-06-26 DIAGNOSIS — J45909 Unspecified asthma, uncomplicated: Secondary | ICD-10-CM | POA: Diagnosis not present

## 2021-06-26 DIAGNOSIS — H9209 Otalgia, unspecified ear: Secondary | ICD-10-CM | POA: Diagnosis not present

## 2021-06-28 ENCOUNTER — Ambulatory Visit (INDEPENDENT_AMBULATORY_CARE_PROVIDER_SITE_OTHER): Payer: 59 | Admitting: Family

## 2021-06-28 VITALS — BP 106/70 | HR 85 | Temp 98.1°F | Ht 67.0 in | Wt 179.0 lb

## 2021-06-28 DIAGNOSIS — F419 Anxiety disorder, unspecified: Secondary | ICD-10-CM | POA: Diagnosis not present

## 2021-06-28 DIAGNOSIS — J45909 Unspecified asthma, uncomplicated: Secondary | ICD-10-CM | POA: Diagnosis not present

## 2021-06-28 DIAGNOSIS — R69 Illness, unspecified: Secondary | ICD-10-CM | POA: Diagnosis not present

## 2021-06-28 DIAGNOSIS — F32A Depression, unspecified: Secondary | ICD-10-CM

## 2021-06-28 DIAGNOSIS — J329 Chronic sinusitis, unspecified: Secondary | ICD-10-CM | POA: Insufficient documentation

## 2021-06-28 MED ORDER — SERTRALINE HCL 50 MG PO TABS: 50.0000 mg | ORAL_TABLET | Freq: Every day | ORAL | 1 refills | Status: DC

## 2021-06-28 MED ORDER — BECLOMETHASONE DIPROP HFA 80 MCG/ACT IN AERB: 1.0000 | INHALATION_SPRAY | Freq: Two times a day (BID) | RESPIRATORY_TRACT | 5 refills | Status: DC

## 2021-06-28 NOTE — Assessment & Plan Note (Signed)
Slight increase in depression. She is on zoloft 50mg  daily. We discussed possibility of increasing to 75mg  once daily. She wishes to see how she feels as we go into the warmer/sunnier months and will let me know if she changes her mind.

## 2021-06-28 NOTE — Progress Notes (Signed)
Subjective:     Patient ID: Melanie Drake, female    DOB: 04/04/1996, 26 y.o.   MRN: [id number]  Chief Complaint  Patient presents with   Medical Management of Chronic Issues    6 m f/u    current sinus infection    Going on since Wednesday  Getting better after antibiotics- saw [facility] on wed.     HPI Patient is in today for for follow up.  Depression- reports "a little dip."  Feels like the cold weather and darkness has impacted her mood. Also, she lost her grandfather in February so that is always a difficult month for hr.    Cefdinir/prednisone prescribed on 2/22 at urgent care for sinusitis. Pt reports that symptoms are improving.   Asthma- notes recent increase in allergies.  Notes that Sunday/Monday she noted that she needed to increase albuterol usage. She continues the Qvar one puff twice daily.    Health Maintenance Due  Topic Date Due   HIV Screening  Never done   Hepatitis C Screening  Never done   COVID-19 Vaccine (4 - Booster for Pfizer series) 04/06/2020    Past Medical History:  Diagnosis Date   Asthma 05/2003   dx. by ped. doc.   Seasonal allergies     No past surgical history on file.  Family History  Problem Relation Age of Onset   Hypertension Mother    Hypothyroidism Mother    Multiple sclerosis Mother    Hyperlipidemia Father    Bladder Cancer Maternal Grandfather    Diabetes Neg Hx    Kidney disease Neg Hx     Social History   Socioeconomic History   Marital status: Single    Spouse name: Not on file   Number of children: Not on file   Years of education: Not on file   Highest education level: Not on file  Occupational History   Occupation: Librarian, academic  Tobacco Use   Smoking status: Never   Smokeless tobacco: Never  Vaping Use   Vaping Use: Never used  Substance and Sexual Activity   Alcohol use: No   Drug use: No   Sexual activity: Never  Other Topics Concern   Not on file  Social History Narrative   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 18 g 5   cefdinir (OMNICEF) 300 MG capsule Take 300 mg by mouth 2 (two) times daily.     predniSONE (DELTASONE) 10 MG tablet Take 40 mg by mouth daily.     beclomethasone (QVAR REDIHALER) 40 MCG/ACT inhaler Inhale 1 puff into the lungs 2 (two) times daily. 1 each 5   beclomethasone (QVAR) 40 MCG/ACT inhaler Inhale 1 puff into the lungs in the morning and at bedtime. 1 each 5   sertraline (ZOLOFT) 50 MG tablet Take 1 tablet (50 mg total) by mouth daily. 90 tablet 1  No facility-administered medications prior to visit.    Allergies  Allergen Reactions   Septra [Bactrim] Rash    ROS     Objective:    Physical Exam Constitutional:      General: She is not in acute distress.    Appearance: Normal appearance. She is well-developed.  HENT:     Head: Normocephalic and atraumatic.     Right Ear: External ear normal.     Left Ear: External ear normal.  Eyes:     General: No scleral icterus. Neck:     Thyroid: No thyromegaly.  Cardiovascular:     Rate and Rhythm: Normal rate and regular rhythm.     Heart sounds: Normal heart sounds. No murmur heard. Pulmonary:     Effort: Pulmonary effort is normal. No respiratory distress.     Breath sounds: Normal breath sounds. No wheezing.  Musculoskeletal:     Cervical back: Neck supple.  Skin:    General: Skin is warm and dry.  Neurological:     Mental Status: She is alert  and oriented to person, place, and time.  Psychiatric:        Mood and Affect: Mood normal.        Behavior: Behavior normal.        Thought Content: Thought content normal.        Judgment: Judgment normal.    BP 106/70    Pulse 85    Temp 98.1 F (36.7 C) (Oral)    Ht 5\' 7"  (1.702 m)    Wt 179 lb (81.2 kg)    SpO2 99%    BMI 28.04 kg/m  Wt Readings from Last 3 Encounters:  06/28/21 179 lb (81.2 kg)  12/25/20 164 lb 9.6 oz (74.7 kg)  06/27/20 158 lb 9.6 oz (71.9 kg)       Assessment & Plan:   Problem List Items Addressed This Visit       Unprioritized   Sinusitis    Improving. Continue cefdinir/prednisone.       Relevant Medications   cefdinir (OMNICEF) 300 MG capsule   predniSONE (DELTASONE) 10 MG tablet   Asthma    Uncontrolled. Will step qvar up from bid to 80 mcg bid. Continue albuterol prn.       Relevant Medications   predniSONE (DELTASONE) 10 MG tablet   beclomethasone (QVAR) 80 MCG/ACT inhaler   Anxiety and depression - Primary    Slight increase in depression. She is on zoloft 50mg  daily. We discussed possibility of increasing to 75mg  once daily. She wishes to see how she feels as we go into the warmer/sunnier months and will let me know if she changes her mind.       Relevant Medications   sertraline (ZOLOFT) 50 MG tablet    I have discontinued Alaycia Habermann's Qvar and Special educational needs teacher. I am also having her start on beclomethasone. Additionally, I am having her maintain her albuterol, cefdinir, predniSONE, and sertraline.  Meds ordered this encounter  Medications   beclomethasone (QVAR) 80 MCG/ACT inhaler    Sig: Inhale 1 puff into the lungs 2 (two) times daily.    Dispense:  1 each    Refill:  5    Order Specific Question:   Supervising Provider    Answer:   Danise Edge A [[id number]]   sertraline (ZOLOFT) 50 MG tablet    Sig: Take 1 tablet (50 mg total) by mouth daily.    Dispense:  90 tablet  Refill:  1    Order Specific Question:    Supervising Provider    Answer:   Danise Edge A [id number]

## 2021-06-28 NOTE — Assessment & Plan Note (Signed)
Improving. Continue cefdinir/prednisone.

## 2021-06-28 NOTE — Patient Instructions (Addendum)
Please increae Qvar to one puff twice daily. Please get the bivalent covid booster when you are feeling better.

## 2021-06-28 NOTE — Assessment & Plan Note (Signed)
Uncontrolled. Will step qvar up from bid to 80 mcg bid. Continue albuterol prn.

## 2021-07-08 ENCOUNTER — Other Ambulatory Visit: Payer: Self-pay | Admitting: Family

## 2021-07-08 ENCOUNTER — Encounter: Payer: Self-pay | Admitting: Family

## 2021-07-08 DIAGNOSIS — R052 Subacute cough: Secondary | ICD-10-CM

## 2021-07-08 MED ORDER — BUDESONIDE-FORMOTEROL FUMARATE 160-4.5 MCG/ACT IN AERO: 1.0000 | INHALATION_SPRAY | Freq: Two times a day (BID) | RESPIRATORY_TRACT | 3 refills | Status: DC

## 2021-07-09 ENCOUNTER — Other Ambulatory Visit: Payer: Self-pay

## 2021-07-09 ENCOUNTER — Ambulatory Visit (HOSPITAL_BASED_OUTPATIENT_CLINIC_OR_DEPARTMENT_OTHER)
Admission: RE | Admit: 2021-07-09 | Discharge: 2021-07-09 | Disposition: A | Discharge status: Home / Self Care | Payer: 59 | Source: Ambulatory Visit | Attending: Family | Admitting: Family

## 2021-07-09 DIAGNOSIS — R052 Subacute cough: Secondary | ICD-10-CM | POA: Diagnosis not present

## 2021-07-09 DIAGNOSIS — R059 Cough, unspecified: Secondary | ICD-10-CM | POA: Diagnosis not present

## 2021-12-27 ENCOUNTER — Encounter: Payer: 59 | Admitting: Family

## 2021-12-31 ENCOUNTER — Encounter: Payer: 59 | Admitting: Family

## 2022-01-07 ENCOUNTER — Encounter: Payer: Self-pay | Admitting: Family

## 2022-01-07 ENCOUNTER — Other Ambulatory Visit: Payer: Self-pay | Admitting: Family

## 2022-01-07 ENCOUNTER — Ambulatory Visit (INDEPENDENT_AMBULATORY_CARE_PROVIDER_SITE_OTHER): Payer: 59 | Admitting: Family

## 2022-01-07 VITALS — BP 125/72 | HR 85 | Temp 98.2°F | Resp 16 | Ht 66.5 in | Wt 192.6 lb

## 2022-01-07 DIAGNOSIS — E785 Hyperlipidemia, unspecified: Secondary | ICD-10-CM

## 2022-01-07 DIAGNOSIS — Z23 Encounter for immunization: Secondary | ICD-10-CM

## 2022-01-07 DIAGNOSIS — F32A Depression, unspecified: Secondary | ICD-10-CM | POA: Diagnosis not present

## 2022-01-07 DIAGNOSIS — F419 Anxiety disorder, unspecified: Secondary | ICD-10-CM | POA: Diagnosis not present

## 2022-01-07 DIAGNOSIS — Z0000 Encounter for general adult medical examination without abnormal findings: Secondary | ICD-10-CM

## 2022-01-07 DIAGNOSIS — R635 Abnormal weight gain: Secondary | ICD-10-CM | POA: Diagnosis not present

## 2022-01-07 LAB — COMPREHENSIVE METABOLIC PANEL
ALT: 13 U/L (ref 0–35)
AST: 15 U/L (ref 0–37)
Albumin: 3.8 g/dL (ref 3.5–5.2)
Alkaline Phosphatase: 57 U/L (ref 39–117)
BUN: 9 mg/dL (ref 6–23)
CO2: 25 mEq/L (ref 19–32)
Calcium: 9 mg/dL (ref 8.4–10.5)
Chloride: 106 mEq/L (ref 96–112)
Creatinine, Ser: 0.78 mg/dL (ref 0.40–1.20)
GFR: 103.46 mL/min (ref >60.00)
Glucose, Bld: 84 mg/dL (ref 70–99)
Potassium: 4 mEq/L (ref 3.5–5.1)
Sodium: 141 mEq/L (ref 135–145)
Total Bilirubin: 0.4 mg/dL (ref 0.2–1.2)
Total Protein: 6.5 g/dL (ref 6.0–8.3)

## 2022-01-07 LAB — TSH: TSH: 2.32 u[IU]/mL (ref 0.35–5.50)

## 2022-01-07 LAB — LIPID PANEL
Cholesterol: 215 mg/dL — ABNORMAL HIGH (ref 0–200)
HDL: 58.5 mg/dL (ref >39.00)
LDL Cholesterol: 136 mg/dL — ABNORMAL HIGH (ref 0–99)
NonHDL: 156.72
Total CHOL/HDL Ratio: 4
Triglycerides: 103 mg/dL (ref 0.0–149.0)
VLDL: 20.6 mg/dL (ref 0.0–40.0)

## 2022-01-07 MED ORDER — SERTRALINE HCL 50 MG PO TABS: 75.0000 mg | ORAL_TABLET | Freq: Every day | ORAL | 0 refills | Status: DC

## 2022-01-07 NOTE — Assessment & Plan Note (Signed)
Wt Readings from Last 3 Encounters:  01/07/22 192 lb 9.6 oz (87.4 kg)  06/28/21 179 lb (81.2 kg)  12/25/20 164 lb 9.6 oz (74.7 kg)   Discussed healthy diet, exercise, weight loss.  I am concerned about the amount of weight she has gained over the last year.  If she continues to have weight gain, we may have to consider changing her zoloft.  Pap up to date. Flu shot today. Recommended covid booster this fall.

## 2022-01-07 NOTE — Assessment & Plan Note (Signed)
Uncontrolled per PHQ-9 and GAD-7.  Will increase zoloft from 50mg  to 75mg .

## 2022-01-07 NOTE — Progress Notes (Signed)
Subjective:   By signing my name below, I, [name], attest that this documentation has been prepared under the direction and in the presence of Melanie Chimera, NP 01/07/2022   Patient ID: Melanie Drake, female    DOB: [birth year], 26 y.o.   MRN: [id number]  Chief Complaint  Patient presents with   Annual Exam    HPI Patient is in today for a comprehensive physical exam  Mood: She is continuing to take 50 Mg of Zoloft. She is interested in increasing her dosage.  Weight: Her weight is increasing. She has been taking her Zoloft medication for about 18 months. She denies of any changes to her hunger levels. She does not think her diet has changed. 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 A typical of breakfast for her is a fast-food chicken/sausage biscuit or she skips breakfast.  Most of the time, she is not hungry during lunch time and therefore skips it. She snacks in between by consuming goldfish. She tends to eat dinner at home. She eats a wide variety of meals such as spaghetti, chicken casserole, grilled chicken, burgers, hot dogs and steak with vegetables on the side. She usually drinks water throughout the day with the occasional Celsius energy drink. She has switched from regular soda to diet soda. She has looked at various gyms but has not committed yet. Exercise for her is walking around her neighborhood.  Wt Readings from Last 3 Encounters:  01/07/22 192 lb 9.6 oz (87.4 kg)  06/28/21 179 lb (81.2 kg)  12/25/20 164 lb 9.6 oz (74.7 kg)   She denies having any fever, new muscle pain, joint pain , new moles, rashes, congestion, sinus pain, sore throat, palpations, cough, SOB ,wheezing,n/v/d constipation, blood in stool, dysuria, frequency, hematuria, depression, anxiety, headaches at this time  Social history: She reports no recent surgeries. She denies of any changes to her family medical history.  Pap Smear: Last completed on  03/01/2020 Immunizations: She is not interested in an HIV/Hep C screening. She has received 3 Covid 19 vaccines. She is interested in receiving an influenza vaccine during today's visit.  Diet: She is trying to maintain a healthy diet.  Exercise: She is trying to exercise regularly but reports that due to heat, has not been able to exercise as regularly.  Dental: She is UTD on dental exams.  Vision:  She is UTD on vision exams.    Health Maintenance Due  Topic Date Due   COVID-19 Vaccine (4 - Pfizer series) 04/06/2020   INFLUENZA VACCINE  12/03/2021    Past Medical History:  Diagnosis Date   Asthma 05/2003   dx. by ped. doc.   Seasonal allergies     History reviewed. No pertinent surgical history.  Family History  Problem Relation Age of Onset   Hypertension Mother    Hypothyroidism Mother    Multiple sclerosis Mother    Hyperlipidemia Father    Bladder Cancer Maternal Grandfather    Diabetes Neg Hx    Kidney disease Neg Hx     Social History   Socioeconomic History   Marital status: Single    Spouse name: Not on file   Number of children: Not on file   Years of education: Not on file   Highest education level: Not on file  Occupational History   Occupation: Herbalist  Tobacco Use   Smoking status: Never   Smokeless tobacco: Never  Vaping Use  Vaping Use: Never used  Substance and Sexual Activity   Alcohol use: No   Drug use: No   Sexual activity: Never  Other Topics Concern   Not on file  Social History Narrative   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   Older brother- married   Enjoys reading, television   No pets   Lives with her parents   Social Determinants of Health   Financial Resource Strain: Low Risk  (09/25/2017)   Overall Financial Resource Strain (CARDIA)    Difficulty of Paying Living Expenses: Not hard at all  Food Insecurity: No Food Insecurity (09/25/2017)   Hunger Vital Sign    Worried About Running Out of Food in the Last Year: Never  true    Ran Out of Food in the Last Year: Never true  Transportation Needs: No Transportation Needs (09/25/2017)   PRAPARE - Transportation    Lack of Transportation (Medical): No    Lack of Transportation (Non-Medical): No  Physical Activity: Unknown (09/25/2017)   Exercise Vital Sign    Days of Exercise per Week: 0 days    Minutes of Exercise per Session: Not on file  Stress: Stress Concern Present (09/25/2017)   [employer] - Occupational Stress Questionnaire    Feeling of Stress : To some extent  Social Connections: Unknown (09/25/2017)   Social Connection and Isolation Panel [NHANES]    Frequency of Communication with Friends and Family: More than three times a week    Frequency of Social Gatherings with Friends and Family: Twice a week    Attends Religious Services: More than 4 times per year    Active Member of Clubs or Organizations: No    Attends Club or Organization Meetings: Never    Marital Status: Not on file  Intimate Partner Violence: Not on file    Outpatient Medications Prior to Visit  Medication Sig Dispense Refill   albuterol (VENTOLIN HFA) 108 (90 Base) MCG/ACT inhaler USE 2 PUFFS EVERY 6 HOURS AS NEEDED FOR SHORTNESS OF BREATH AND WHEEZING. 18 g 5   budesonide-formoterol (SYMBICORT) 160-4.5 MCG/ACT inhaler Inhale 1 puff into the lungs 2 (two) times daily. 1 each 3   sertraline (ZOLOFT) 50 MG tablet Take 1 tablet (50 mg total) by mouth daily. 90 tablet 1   cefdinir (OMNICEF) 300 MG capsule Take 300 mg by mouth 2 (two) times daily.     predniSONE (DELTASONE) 10 MG tablet Take 40 mg by mouth daily.     No facility-administered medications prior to visit.    Allergies  Allergen Reactions   Septra [Bactrim] Rash    Review of Systems  Constitutional:  Negative for fever.  HENT:  Negative for congestion, sinus pain and sore throat.   Respiratory:  Negative for cough, shortness of breath and wheezing.   Cardiovascular:  Negative for  palpitations.  Gastrointestinal:  Negative for blood in stool, constipation, diarrhea, nausea and vomiting.  Genitourinary:  Negative for dysuria, frequency and hematuria.  Musculoskeletal:  Negative for joint pain and myalgias.  Skin:  Negative for rash.       (-) [place]  Neurological:  Negative for headaches.  Psychiatric/Behavioral:  Negative for depression. The patient is not nervous/anxious.        Objective:    Physical Exam Constitutional:      General: She is not in acute distress.    Appearance: Normal appearance. She is not ill-appearing.  HENT:     Head: Normocephalic and atraumatic.       Right Ear: Tympanic membrane, ear canal and external ear normal.     Left Ear: Tympanic membrane, ear canal and external ear normal.     Mouth/Throat:     Pharynx: No oropharyngeal exudate.  Eyes:     Extraocular Movements: Extraocular movements intact.     Pupils: Pupils are equal, round, and reactive to light.  Neck:     Thyroid: No thyromegaly.  Cardiovascular:     Rate and Rhythm: Normal rate and regular rhythm.     Heart sounds: Normal heart sounds. No murmur heard.    No gallop.  Pulmonary:     Effort: Pulmonary effort is normal. No respiratory distress.     Breath sounds: Normal breath sounds. No wheezing or rales.  Abdominal:     General: Bowel sounds are normal. There is no distension.     Palpations: Abdomen is soft.     Tenderness: There is no abdominal tenderness. There is no guarding.  Musculoskeletal:     Comments: 5/5 strength in both upper and lower extremities    Lymphadenopathy:     Cervical: No cervical adenopathy.  Skin:    General: Skin is warm and dry.  Neurological:     Mental Status: She is alert and oriented to person, place, and time.     Deep Tendon Reflexes:     Reflex Scores:      Patellar reflexes are 2+ on the right side and 2+ on the left side. Psychiatric:        Mood and Affect: Mood normal.        Behavior: Behavior normal.         Judgment: Judgment normal.     BP 125/72 (BP Location: Left Arm, Patient Position: Sitting, Cuff Size: Small)   Pulse 85   Temp 98.2 F (36.8 C) (Oral)   Resp 16   Ht 5' 6.5" (1.689 m)   Wt 192 lb 9.6 oz (87.4 kg)   SpO2 100%   BMI 30.62 kg/m  Wt Readings from Last 3 Encounters:  01/07/22 192 lb 9.6 oz (87.4 kg)  06/28/21 179 lb (81.2 kg)  12/25/20 164 lb 9.6 oz (74.7 kg)       Assessment & Plan:   Problem List Items Addressed This Visit       Unprioritized   Preventative health care - Primary    Wt Readings from Last 3 Encounters:  01/07/22 192 lb 9.6 oz (87.4 kg)  06/28/21 179 lb (81.2 kg)  12/25/20 164 lb 9.6 oz (74.7 kg)  Discussed healthy diet, exercise, weight loss.  I am concerned about the amount of weight she has gained over the last year.  If she continues to have weight gain, we may have to consider changing her zoloft.  Pap up to date. Flu shot today. Recommended covid booster this fall.       Anxiety and depression    Uncontrolled per PHQ-9 and GAD-7.  Will increase zoloft from 86m to 774m       Relevant Medications   sertraline (ZOLOFT) 50 MG tablet   Other Visit Diagnoses     Hyperlipidemia, unspecified hyperlipidemia type       Relevant Orders   Comp Met (CMET)   Lipid panel   Weight gain       Relevant Orders   TSH       Meds ordered this encounter  Medications   sertraline (ZOLOFT) 50 MG tablet    Sig: Take 1.5 tablets (75 mg  total) by mouth daily.    Dispense:  135 tablet    Refill:  0    Order Specific Question:   Supervising Provider    Answer:   Penni Homans A [[id number]]    I, Nance Pear, NP, personally preformed the services described in this documentation.  All medical record entries made by the scribe were at my direction and in my presence.  I have reviewed the chart and discharge instructions (if applicable) and agree that the record reflects my personal performance and is accurate and complete.  01/07/2022   I,Amber Collins,acting as a scribe for Nance Pear, NP.,have documented all relevant documentation on the behalf of Nance Pear, NP,as directed by  Nance Pear, NP while in the presence of Nance Pear, NP.    Nance Pear, NP

## 2022-01-07 NOTE — Patient Instructions (Signed)
Please increase zoloft from 50mg  to 75mg . Try to exercise 30 minutes 5 days a week. Eat a diet rich in fruits/vegetables and lean proteins.

## 2022-02-13 DIAGNOSIS — E049 Nontoxic goiter, unspecified: Secondary | ICD-10-CM | POA: Diagnosis not present

## 2022-02-13 DIAGNOSIS — Z6830 Body mass index (BMI) 30.0-30.9, adult: Secondary | ICD-10-CM | POA: Diagnosis not present

## 2022-02-13 DIAGNOSIS — Z01419 Encounter for gynecological examination (general) (routine) without abnormal findings: Secondary | ICD-10-CM | POA: Diagnosis not present

## 2022-02-14 ENCOUNTER — Other Ambulatory Visit: Payer: Self-pay | Admitting: Obstetrics and Gynecology

## 2022-02-14 DIAGNOSIS — E049 Nontoxic goiter, unspecified: Secondary | ICD-10-CM

## 2022-02-18 ENCOUNTER — Ambulatory Visit (INDEPENDENT_AMBULATORY_CARE_PROVIDER_SITE_OTHER): Payer: 59 | Admitting: Family

## 2022-02-18 DIAGNOSIS — J45909 Unspecified asthma, uncomplicated: Secondary | ICD-10-CM

## 2022-02-18 DIAGNOSIS — F32A Depression, unspecified: Secondary | ICD-10-CM

## 2022-02-18 DIAGNOSIS — F419 Anxiety disorder, unspecified: Secondary | ICD-10-CM

## 2022-02-18 DIAGNOSIS — J302 Other seasonal allergic rhinitis: Secondary | ICD-10-CM

## 2022-02-18 DIAGNOSIS — R69 Illness, unspecified: Secondary | ICD-10-CM | POA: Diagnosis not present

## 2022-02-18 MED ORDER — BUDESONIDE-FORMOTEROL FUMARATE 160-4.5 MCG/ACT IN AERO: 1.0000 | INHALATION_SPRAY | Freq: Two times a day (BID) | RESPIRATORY_TRACT | 3 refills | Status: DC

## 2022-02-18 MED ORDER — SERTRALINE HCL 50 MG PO TABS: 75.0000 mg | ORAL_TABLET | Freq: Every day | ORAL | 0 refills | Status: DC

## 2022-02-18 NOTE — Assessment & Plan Note (Signed)
Improved on symbicort. Continue same.

## 2022-02-18 NOTE — Assessment & Plan Note (Signed)
Continue claritin or zyrtec.  Recommend that she add flonase.

## 2022-02-18 NOTE — Progress Notes (Signed)
Subjective:   By signing my name below, I, Melanie Drake, attest that this documentation has been prepared under the direction and in the presence of Birdie Sons, NP 02/18/2022   Patient ID: Melanie Drake, female    DOB: [birth year], 26 y.o.   MRN: [id number]  Chief Complaint  Patient presents with   Depression    Here for follow up    HPI Patient is in today for an office visit  Refills: She is requesting a refill of 160-4.5 Mcg of Symbicort and 50 Mg of Zoloft  Mood: She reports that her mood is improving. She has job stress but stressors are improving. She reports that her job office is moving to [place], [place] in about a year. She is currently taking 50 Mg of Zoloft  Weight: Her weight is decreasing Wt Readings from Last 3 Encounters:  02/18/22 190 lb (86.2 kg)  01/07/22 192 lb 9.6 oz (87.4 kg)  06/28/21 179 lb (81.2 kg)   Asthma: She states that symptoms are improving. She is taking 160-4.5 MCG/ACT of Symbicort and reports that she does not have to use her Albuterol as often as before.   Allergies: She takes Claritin in the mornings and Zyrtec or Benadryl in the evening. She has tried Flonase in the past and states that it does alleviate her symptoms.   Immunizations: Her influenza vaccine was last completed on 01/07/2022  Health Maintenance Due  Topic Date Due   COVID-19 Vaccine (4 - Pfizer series) 04/06/2020    Past Medical History:  Diagnosis Date   Asthma 05/2003   dx. by ped. doc.   Seasonal allergies     No past surgical history on file.  Family History  Problem Relation Age of Onset   Hypertension Mother    Hypothyroidism Mother    Multiple sclerosis Mother    Hyperlipidemia Father    Bladder Cancer Maternal Grandfather    Diabetes Neg Hx    Kidney disease Neg Hx     Social History   Socioeconomic History   Marital status: Single    Spouse name: Not on file   Number of children: Not on file   Years of education: Not on file    Highest education level: Not on file  Occupational History   Occupation: Librarian, academic  Tobacco Use   Smoking status: Never   Smokeless tobacco: Never  Vaping Use   Vaping Use: Never used  Substance and Sexual Activity   Alcohol use: No   Drug use: No   Sexual activity: Never  Other Topics Concern   Not on file  Social History Narrative   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   Older brother- married   Enjoys reading, television   No pets   Lives with her parents   Social Determinants of Health   Financial Resource Strain: Low Risk  (09/25/2017)   Overall Financial Resource Strain (CARDIA)    Difficulty of Paying Living Expenses: Not hard at all  Food Insecurity: No Food Insecurity (09/25/2017)   Hunger Vital Sign    Worried About Running Out of Food in the Last Year: Never true    Ran Out of Food in the Last Year: Never true  Transportation Needs: No Transportation Needs (09/25/2017)   PRAPARE - Administrator, Civil Service (Medical): No    Lack of Transportation (Non-Medical): No  Physical Activity: Unknown (09/25/2017)   Exercise Vital Sign    Days of Exercise per Week: 0  days    Minutes of Exercise per Session: Not on file  Stress: Stress Concern Present (09/25/2017)   [employer] of Occupational Health - Occupational Stress Questionnaire    Feeling of Stress : To some extent  Social Connections: Unknown (09/25/2017)   Social Connection and Isolation Panel [NHANES]    Frequency of Communication with Friends and Family: More than three times a week    Frequency of Social Gatherings with Friends and Family: Twice a week    Attends Religious Services: More than 4 times per year    Active Member of [employer] or Organizations: No    Attends Engineer, structural: Never    Marital Status: Not on file  Intimate Partner Violence: Not on file    Outpatient Medications Prior to Visit  Medication Sig Dispense Refill   albuterol (VENTOLIN HFA) 108 (90 Base)  MCG/ACT inhaler USE 2 PUFFS EVERY 6 HOURS AS NEEDED FOR SHORTNESS OF BREATH AND WHEEZING. 18 g 5   budesonide-formoterol (SYMBICORT) 160-4.5 MCG/ACT inhaler Inhale 1 puff into the lungs 2 (two) times daily. 1 each 3   sertraline (ZOLOFT) 50 MG tablet Take 1.5 tablets (75 mg total) by mouth daily. 135 tablet 0   No facility-administered medications prior to visit.    Allergies  Allergen Reactions   Septra [Bactrim] Rash    ROS See HPI    Objective:    Physical Exam Constitutional:      General: She is not in acute distress.    Appearance: Normal appearance. She is not ill-appearing.  HENT:     Head: Normocephalic and atraumatic.     Right Ear: External ear normal.     Left Ear: External ear normal.  Eyes:     Extraocular Movements: Extraocular movements intact.     Pupils: Pupils are equal, round, and reactive to light.  Cardiovascular:     Rate and Rhythm: Normal rate and regular rhythm.     Heart sounds: Normal heart sounds. No murmur heard.    No gallop.  Pulmonary:     Effort: Pulmonary effort is normal. No respiratory distress.     Breath sounds: Normal breath sounds. No wheezing or rales.  Skin:    General: Skin is warm and dry.  Neurological:     Mental Status: She is alert and oriented to person, place, and time.  Psychiatric:        Mood and Affect: Mood normal.        Behavior: Behavior normal.        Judgment: Judgment normal.     BP 129/79 (BP Location: Left Arm, Patient Position: Sitting, Cuff Size: Small)   Pulse 85   Temp 98.3 F (36.8 C) (Oral)   Resp 16   Wt 190 lb (86.2 kg)   SpO2 99%   BMI 30.21 kg/m  Wt Readings from Last 3 Encounters:  02/18/22 190 lb (86.2 kg)  01/07/22 192 lb 9.6 oz (87.4 kg)  06/28/21 179 lb (81.2 kg)       Assessment & Plan:   Problem List Items Addressed This Visit       Unprioritized   Seasonal allergies    Continue claritin or zyrtec.  Recommend that she add flonase.      Asthma    Improved on  symbicort. Continue same.       Relevant Medications   budesonide-formoterol (SYMBICORT) 160-4.5 MCG/ACT inhaler   Anxiety and depression    Wt Readings from Last 3 Encounters:  02/18/22 190 lb (86.2 kg)  01/07/22 192 lb 9.6 oz (87.4 kg)  06/28/21 179 lb (81.2 kg)  Stable on current dose of sertraline. Weight I down a couple of pounds since last month, but up since last winter. Patient is advised to focus on healthy diet and exercise and let me know if she has any further weight gain.       Relevant Medications   sertraline (ZOLOFT) 50 MG tablet   Meds ordered this encounter  Medications   sertraline (ZOLOFT) 50 MG tablet    Sig: Take 1.5 tablets (75 mg total) by mouth daily.    Dispense:  135 tablet    Refill:  0    Order Specific Question:   Supervising Provider    Answer:   Penni Homans A [[id number]]   budesonide-formoterol (SYMBICORT) 160-4.5 MCG/ACT inhaler    Sig: Inhale 1 puff into the lungs 2 (two) times daily.    Dispense:  1 each    Refill:  3    Order Specific Question:   Supervising Provider    Answer:   Penni Homans A [[id number]]    I, Nance Pear, NP, personally preformed the services described in this documentation.  All medical record entries made by the scribe were at my direction and in my presence.  I have reviewed the chart and discharge instructions (if applicable) and agree that the record reflects my personal performance and is accurate and complete. 02/18/2022   I,Amber Collins,acting as a scribe for Nance Pear, NP.,have documented all relevant documentation on the behalf of Nance Pear, NP,as directed by  Nance Pear, NP while in the presence of Nance Pear, NP.    Nance Pear, NP

## 2022-02-18 NOTE — Assessment & Plan Note (Addendum)
Wt Readings from Last 3 Encounters:  02/18/22 190 lb (86.2 kg)  01/07/22 192 lb 9.6 oz (87.4 kg)  06/28/21 179 lb (81.2 kg)   Stable on current dose of sertraline. Weight I down a couple of pounds since last month, but up since last winter. Patient is advised to focus on healthy diet and exercise and let me know if she has any further weight gain.

## 2022-02-26 ENCOUNTER — Other Ambulatory Visit: Payer: Self-pay | Admitting: Family

## 2022-03-13 ENCOUNTER — Ambulatory Visit
Admission: RE | Admit: 2022-03-13 | Discharge: 2022-03-13 | Disposition: A | Discharge status: Home / Self Care | Payer: 59 | Source: Ambulatory Visit | Attending: Obstetrics and Gynecology | Admitting: Obstetrics and Gynecology

## 2022-03-13 DIAGNOSIS — E049 Nontoxic goiter, unspecified: Secondary | ICD-10-CM

## 2022-03-13 DIAGNOSIS — E041 Nontoxic single thyroid nodule: Secondary | ICD-10-CM | POA: Diagnosis not present

## 2022-07-15 ENCOUNTER — Other Ambulatory Visit: Payer: Self-pay | Admitting: Family

## 2022-08-20 ENCOUNTER — Encounter: Payer: Self-pay | Admitting: Family

## 2022-08-20 ENCOUNTER — Ambulatory Visit: Payer: No Typology Code available for payment source | Admitting: Family

## 2022-08-20 VITALS — BP 121/72 | HR 78 | Temp 98.2°F | Resp 16 | Ht 66.5 in | Wt 183.0 lb

## 2022-08-20 DIAGNOSIS — F419 Anxiety disorder, unspecified: Secondary | ICD-10-CM

## 2022-08-20 DIAGNOSIS — F32A Depression, unspecified: Secondary | ICD-10-CM

## 2022-08-20 DIAGNOSIS — J45909 Unspecified asthma, uncomplicated: Secondary | ICD-10-CM | POA: Diagnosis not present

## 2022-08-20 DIAGNOSIS — J302 Other seasonal allergic rhinitis: Secondary | ICD-10-CM

## 2022-08-20 MED ORDER — SERTRALINE HCL 50 MG PO TABS: ORAL_TABLET | ORAL | 0 refills | Status: DC

## 2022-08-20 MED ORDER — BUDESONIDE-FORMOTEROL FUMARATE 160-4.5 MCG/ACT IN AERO: INHALATION_SPRAY | RESPIRATORY_TRACT | 5 refills | Status: DC

## 2022-08-20 NOTE — Assessment & Plan Note (Addendum)
Stable overall on zoloft. Weight has come down some since last visit. She is enjoying her new job at [employer] working at [employer].  Wt Readings from Last 3 Encounters:  08/20/22 183 lb (83 kg)  02/18/22 190 lb (86.2 kg)  01/07/22 192 lb 9.6 oz (87.4 kg)

## 2022-08-20 NOTE — Assessment & Plan Note (Signed)
Stable overall with symbicort and prn albuterol.

## 2022-08-20 NOTE — Assessment & Plan Note (Addendum)
She is managing with her current OTC regimen. We discussed if symptoms worsen then Singulair would be an option.

## 2022-08-20 NOTE — Progress Notes (Signed)
Subjective:   By signing my name below, I, Barrett Shell, attest that this documentation has been prepared under the direction and in the presence of Sandford Craze, NP. 08/20/2022   Patient ID: Melanie Drake, female    DOB: 10-Sep-1995, 27 y.o.   MRN: [id number]  Chief Complaint  Patient presents with   Anxiety    "Doing well on medication"    Depression    Here for follow up    HPI Patient is in today for a follow-up appointment.   Zoloft: She takes 50 mg Zoloft daily PO and finds relief with it.   Asthma: Her allergies are worse than normal recently. She is taking flonase and claritin in the AM and Zyrtec at night.  This seems to be holding her in terms of her symptoms. She is using Symbicort two times a day. She also reports having to use albuterol a few times in the past few weeks.   Immunizations: She is UTD on the latest Covid-19 immunization.   Past Medical History:  Diagnosis Date   Asthma 05/2003   dx. by ped. doc.   Seasonal allergies     No past surgical history on file.  Family History  Problem Relation Age of Onset   Hypertension Mother    Hypothyroidism Mother    Multiple sclerosis Mother    Hyperlipidemia Father    Bladder Cancer Maternal Grandfather    Diabetes Neg Hx    Kidney disease Neg Hx     Social History   Socioeconomic History   Marital status: Single    Spouse name: Not on file   Number of children: Not on file   Years of education: Not on file   Highest education level: Not on file  Occupational History   Occupation: Librarian, academic  Tobacco Use   Smoking status: Never   Smokeless tobacco: Never  Vaping Use   Vaping Use: Never used  Substance and Sexual Activity   Alcohol use: No   Drug use: No   Sexual activity: Never  Other Topics Concern   Not on file  Social History Narrative   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   Older brother- married   Enjoys reading, television   No pets   Lives with her parents   Social  Determinants of Health   Financial Resource Strain: Low Risk  (08/19/2022)   Overall Financial Resource Strain (CARDIA)    Difficulty of Paying Living Expenses: Not very hard  Food Insecurity: No Food Insecurity (08/19/2022)   Hunger Vital Sign    Worried About Running Out of Food in the Last Year: Never true    Ran Out of Food in the Last Year: Never true  Transportation Needs: No Transportation Needs (08/19/2022)   PRAPARE - Administrator, Civil Service (Medical): No    Lack of Transportation (Non-Medical): No  Physical Activity: Insufficiently Active (08/19/2022)   Exercise Vital Sign    Days of Exercise per Week: 2 days    Minutes of Exercise per Session: 30 min  Stress: Stress Concern Present (08/19/2022)   [employer] of Occupational Health - Occupational Stress Questionnaire    Feeling of Stress : To some extent  Social Connections: Moderately Isolated (08/19/2022)   Social Connection and Isolation Panel [NHANES]    Frequency of Communication with Friends and Family: More than three times a week    Frequency of Social Gatherings with Friends and Family: Once a week    Attends  Religious Services: More than 4 times per year    Active Member of Clubs or Organizations: No    Attends Banker Meetings: Not on file    Marital Status: Never married  Intimate Partner Violence: Not on file    Outpatient Medications Prior to Visit  Medication Sig Dispense Refill   albuterol (VENTOLIN HFA) 108 (90 Base) MCG/ACT inhaler USE 2 PUFFS EVERY 6 HOURS AS NEEDED FOR SHORTNESS OF BREATH AND WHEEZING. 18 g 5   budesonide-formoterol (SYMBICORT) 160-4.5 MCG/ACT inhaler INHALE 1 PUFF INTO THE LUNGS TWICE DAILY 10.2 each 2   sertraline (ZOLOFT) 50 MG tablet Take 1&1/2 tablets (75 mg total) by mouth daily. 135 tablet 0   No facility-administered medications prior to visit.    Allergies  Allergen Reactions   Septra [Bactrim] Rash    ROS See HPI    Objective:     Physical Exam Constitutional:      General: She is not in acute distress.    Appearance: Normal appearance.  HENT:     Head: Normocephalic and atraumatic.     Right Ear: External ear normal.     Left Ear: External ear normal.  Eyes:     Extraocular Movements: Extraocular movements intact.     Pupils: Pupils are equal, round, and reactive to light.  Cardiovascular:     Rate and Rhythm: Normal rate and regular rhythm.     Heart sounds: Normal heart sounds. No murmur heard.    No gallop.  Pulmonary:     Effort: Pulmonary effort is normal. No respiratory distress.     Breath sounds: Normal breath sounds. No wheezing or rales.  Skin:    General: Skin is warm.  Neurological:     Mental Status: She is alert and oriented to person, place, and time.  Psychiatric:        Judgment: Judgment normal.     BP 121/72 (BP Location: Left Arm, Patient Position: Sitting, Cuff Size: Small)   Pulse 78   Temp 98.2 F (36.8 C) (Oral)   Resp 16   Ht 5' 6.5" (1.689 m)   Wt 183 lb (83 kg)   SpO2 100%   BMI 29.09 kg/m  Wt Readings from Last 3 Encounters:  08/20/22 183 lb (83 kg)  02/18/22 190 lb (86.2 kg)  01/07/22 192 lb 9.6 oz (87.4 kg)       Assessment & Plan:  Uncomplicated asthma, unspecified asthma severity, unspecified whether persistent Assessment & Plan: Stable overall with symbicort and prn albuterol.    Seasonal allergies Assessment & Plan: She is managing with her current OTC regimen. We discussed if symptoms worsen then Singulair would be an option.    Anxiety and depression Assessment & Plan: Stable overall on zoloft. Weight has come down some since last visit. She is enjoying her new job at [employer] working at [employer].  Wt Readings from Last 3 Encounters:  08/20/22 183 lb (83 kg)  02/18/22 190 lb (86.2 kg)  01/07/22 192 lb 9.6 oz (87.4 kg)      Other orders -     Sertraline HCl; Take 1&1/2 tablets (75 mg total) by mouth daily.  Dispense: 135  tablet; Refill: 0 -     Budesonide-Formoterol Fumarate; INHALE 1 PUFF INTO THE LUNGS TWICE DAILY  Dispense: 10.2 each; Refill: 5    I, Lemont Fillers, NP, personally preformed the services described in this documentation.  All medical record entries made by the scribe were  at my direction and in my presence.  I have reviewed the chart and discharge instructions (if applicable) and agree that the record reflects my personal performance and is accurate and complete. 08/20/2022  Lemont Fillers, NP  [facility] as a scribe for Lemont Fillers, NP.,have documented all relevant documentation on the behalf of Lemont Fillers, NP,as directed by  Lemont Fillers, NP while in the presence of Lemont Fillers, NP.

## 2022-09-04 ENCOUNTER — Encounter: Payer: Self-pay | Admitting: Family

## 2022-09-05 MED ORDER — MONTELUKAST SODIUM 10 MG PO TABS: 10.0000 mg | ORAL_TABLET | Freq: Every day | ORAL | 3 refills | Status: DC

## 2022-09-09 ENCOUNTER — Other Ambulatory Visit: Payer: Self-pay | Admitting: Family

## 2023-01-11 ENCOUNTER — Other Ambulatory Visit: Payer: Self-pay | Admitting: Family

## 2023-01-14 IMAGING — DX DG CHEST 2V
2 series · 2 of 2 positions shown · non-contrast
Comparison: None.

CLINICAL DATA: Cough for 2 weeks.

EXAM:
CHEST - 2 VIEW

[chest pa]
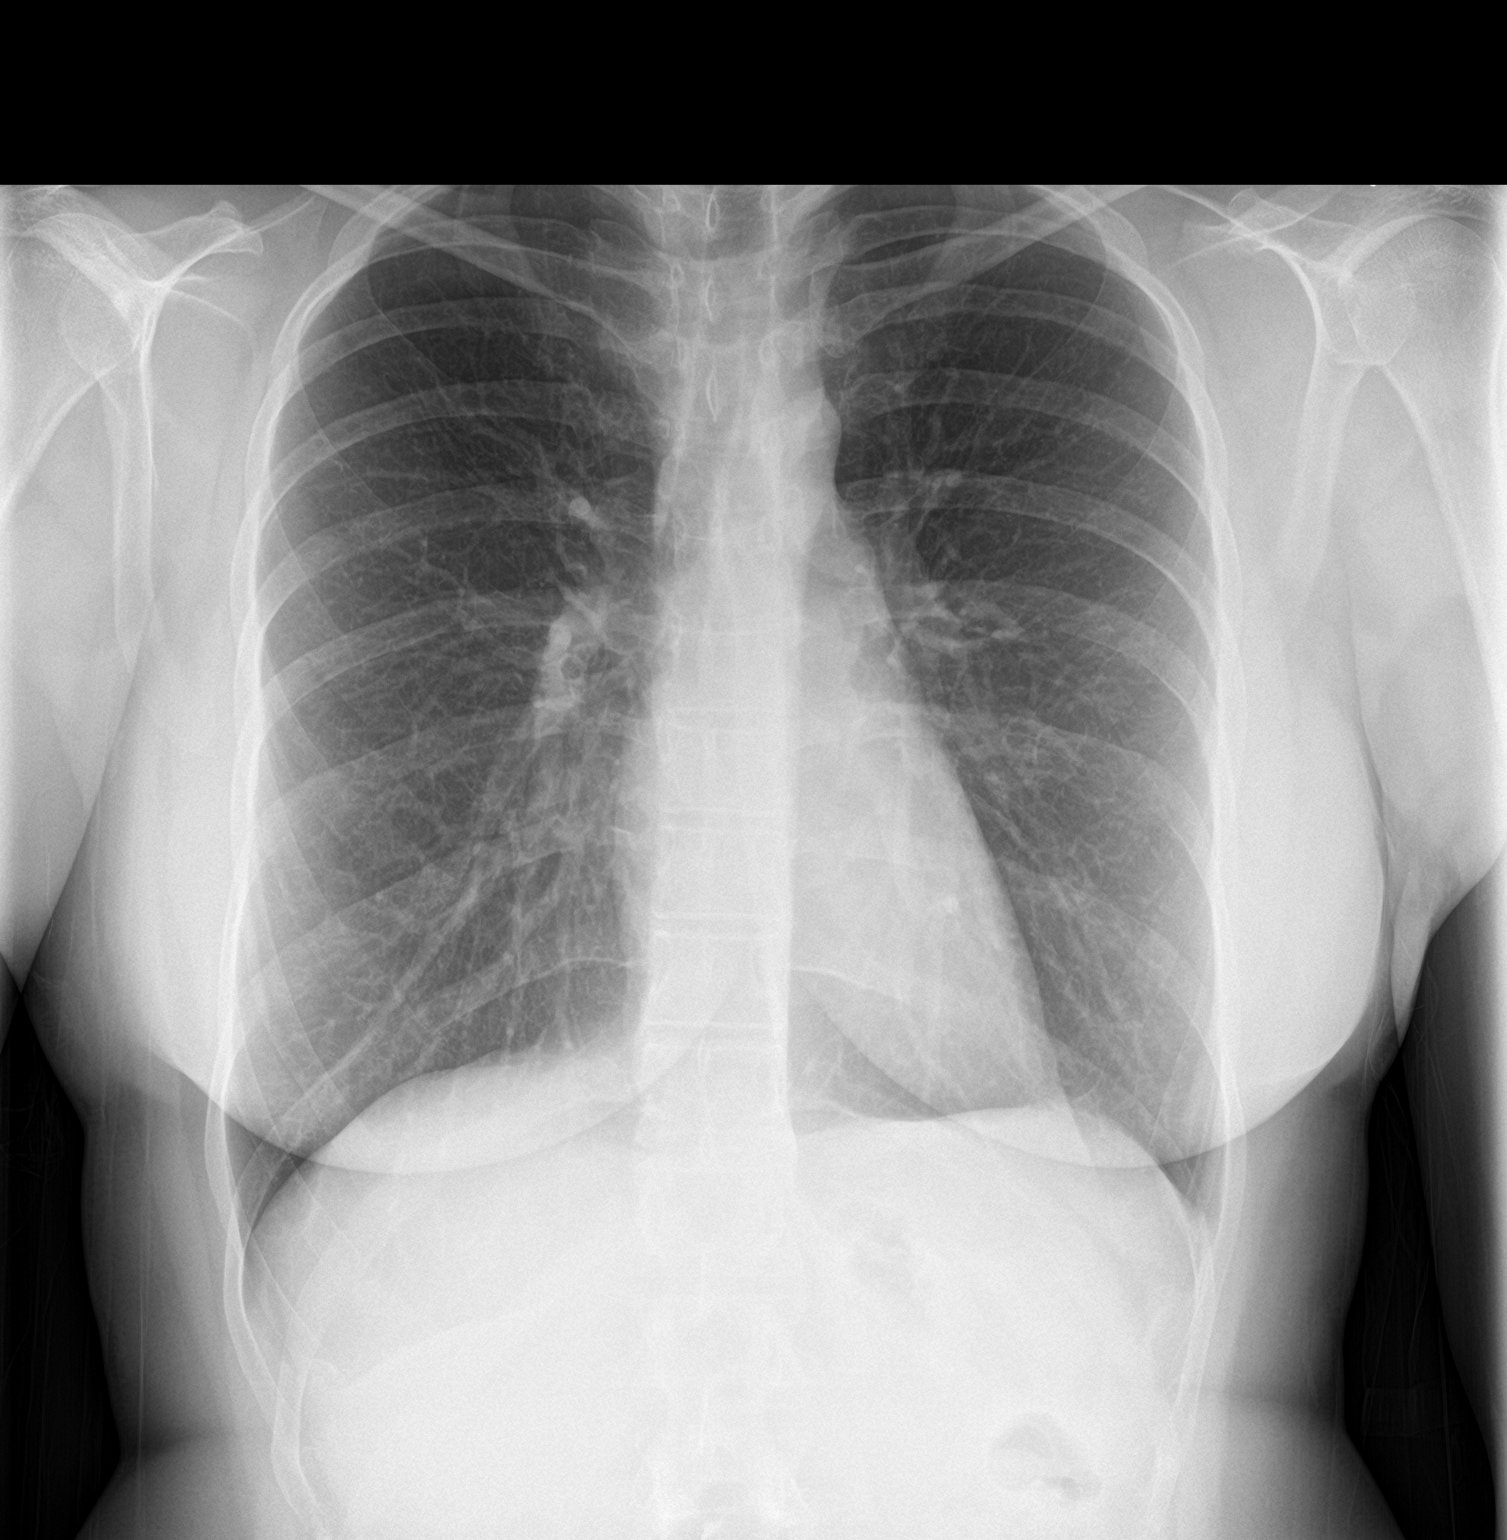

[chest lat]
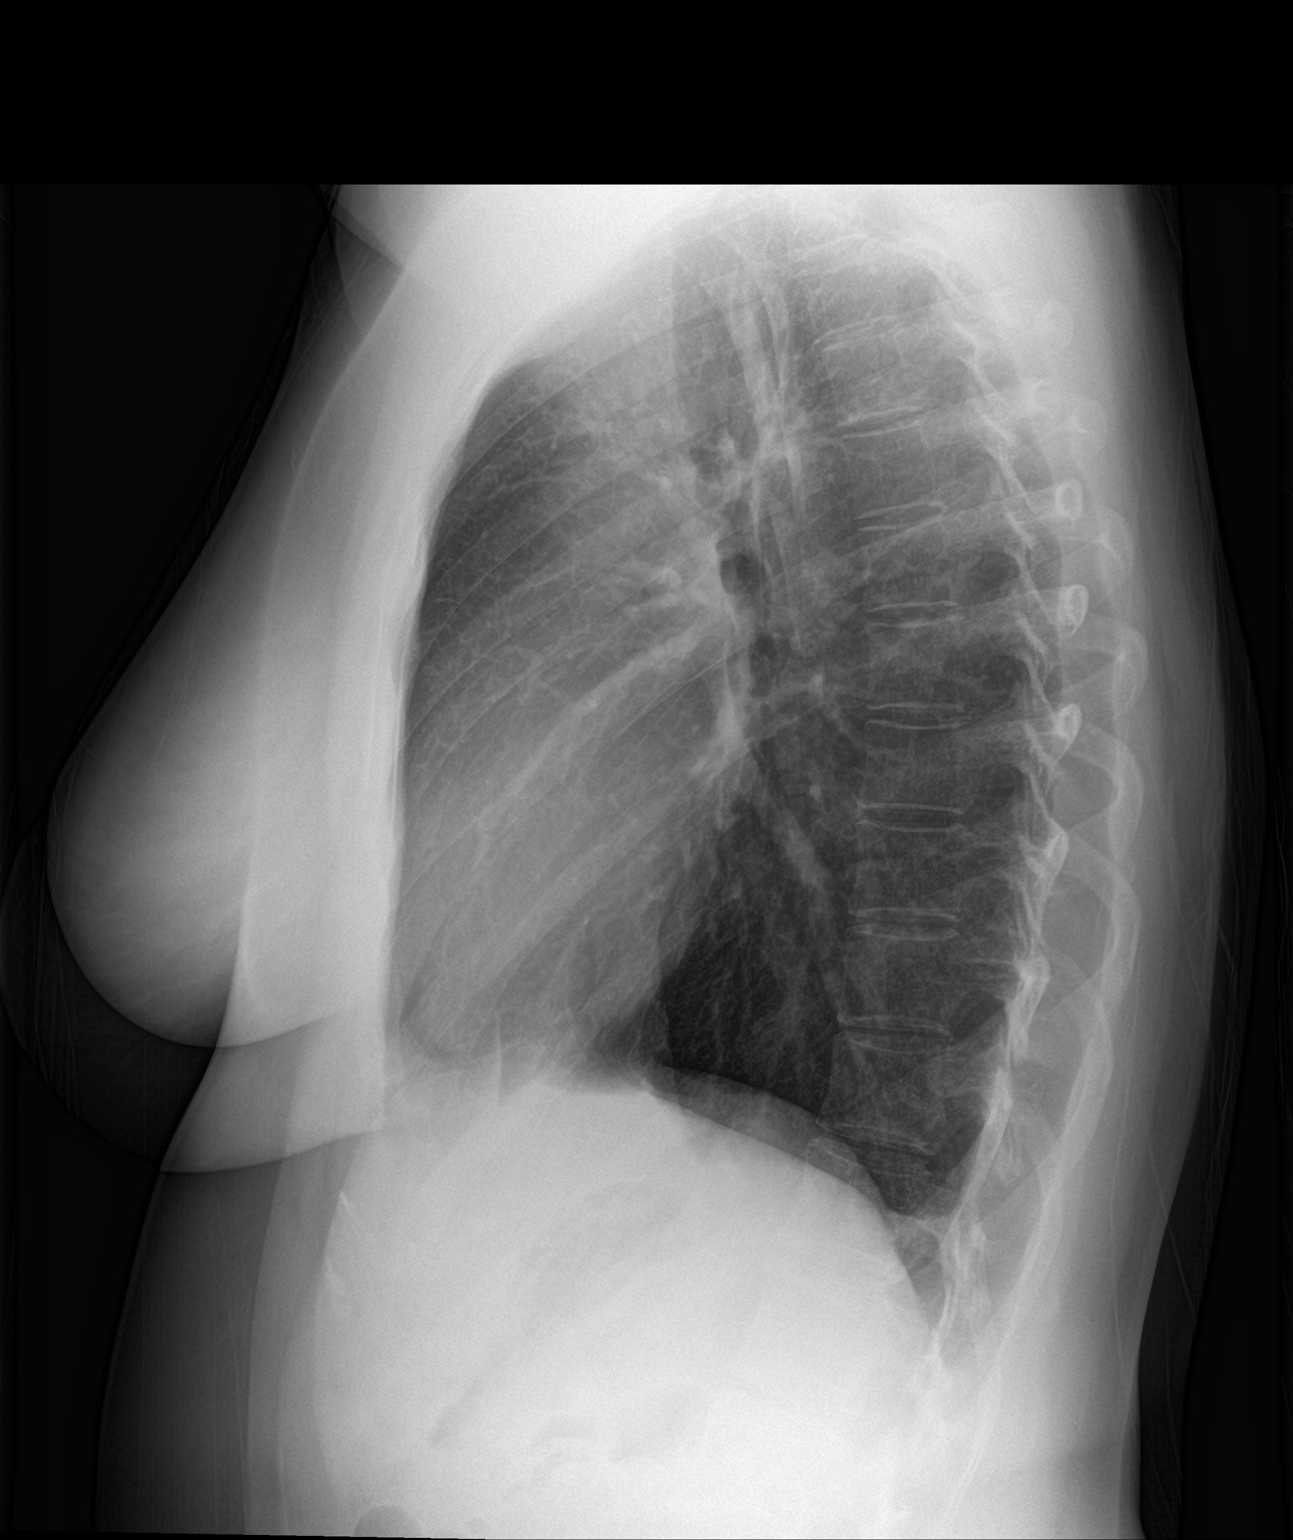

[2 of 2 positions shown; findings below may reference images not displayed]

FINDINGS: The heart size and mediastinal contours are within normal limits.
Both lungs are clear. The visualized skeletal structures are
unremarkable.
IMPRESSION: Normal exam.

## 2023-02-23 ENCOUNTER — Encounter: Payer: Self-pay | Admitting: Family

## 2023-02-23 ENCOUNTER — Ambulatory Visit (INDEPENDENT_AMBULATORY_CARE_PROVIDER_SITE_OTHER): Payer: No Typology Code available for payment source | Admitting: Family

## 2023-02-23 VITALS — BP 128/76 | HR 74 | Temp 98.4°F | Resp 16 | Ht 66.5 in | Wt 193.0 lb

## 2023-02-23 DIAGNOSIS — J45909 Unspecified asthma, uncomplicated: Secondary | ICD-10-CM

## 2023-02-23 DIAGNOSIS — Z0000 Encounter for general adult medical examination without abnormal findings: Secondary | ICD-10-CM | POA: Diagnosis not present

## 2023-02-23 DIAGNOSIS — J302 Other seasonal allergic rhinitis: Secondary | ICD-10-CM | POA: Diagnosis not present

## 2023-02-23 DIAGNOSIS — F419 Anxiety disorder, unspecified: Secondary | ICD-10-CM | POA: Diagnosis not present

## 2023-02-23 DIAGNOSIS — F32A Depression, unspecified: Secondary | ICD-10-CM

## 2023-02-23 DIAGNOSIS — E785 Hyperlipidemia, unspecified: Secondary | ICD-10-CM

## 2023-02-23 DIAGNOSIS — R635 Abnormal weight gain: Secondary | ICD-10-CM | POA: Diagnosis not present

## 2023-02-23 LAB — COMPREHENSIVE METABOLIC PANEL
ALT: 15 U/L (ref 0–35)
AST: 14 U/L (ref 0–37)
Albumin: 4.4 g/dL (ref 3.5–5.2)
Alkaline Phosphatase: 51 U/L (ref 39–117)
BUN: 12 mg/dL (ref 6–23)
CO2: 25 meq/L (ref 19–32)
Calcium: 9.4 mg/dL (ref 8.4–10.5)
Chloride: 104 meq/L (ref 96–112)
Creatinine, Ser: 0.76 mg/dL (ref 0.40–1.20)
GFR: 105.89 mL/min (ref >60.00)
Glucose, Bld: 102 mg/dL — ABNORMAL HIGH (ref 70–99)
Potassium: 4.5 meq/L (ref 3.5–5.1)
Sodium: 137 meq/L (ref 135–145)
Total Bilirubin: 0.3 mg/dL (ref 0.2–1.2)
Total Protein: 7 g/dL (ref 6.0–8.3)

## 2023-02-23 LAB — LIPID PANEL
Cholesterol: 279 mg/dL — ABNORMAL HIGH (ref 0–200)
HDL: 65.1 mg/dL (ref >39.00)
LDL Cholesterol: 190 mg/dL — ABNORMAL HIGH (ref 0–99)
NonHDL: 213.65
Total CHOL/HDL Ratio: 4
Triglycerides: 117 mg/dL (ref 0.0–149.0)
VLDL: 23.4 mg/dL (ref 0.0–40.0)

## 2023-02-23 LAB — TSH: TSH: 2.28 u[IU]/mL (ref 0.35–5.50)

## 2023-02-23 NOTE — Assessment & Plan Note (Addendum)
-  Continue effort with healthy diet, exercise and weight loss.  -Consider COVID booster. -Upcoming Pap smear in December. -Order kidney function and electrolyte tests. -Over-the-counter earwax drops recommended for earwax buildup.

## 2023-02-23 NOTE — Assessment & Plan Note (Signed)
Uncontrolled. She plans to resume singulair but does note that it seems to increase her appetite.

## 2023-02-23 NOTE — Assessment & Plan Note (Signed)
Stable. Continues symbicort regularly which has been stable.

## 2023-02-23 NOTE — Assessment & Plan Note (Signed)
Reports stable mood on sertraline.  Monitor.

## 2023-02-23 NOTE — Patient Instructions (Signed)
VISIT SUMMARY:  During your recent visit, we discussed your asthma, depression, hyperlipidemia, and plantar fasciitis. You are doing well with your current medications for asthma and depression. However, we discussed the possibility of restarting Singulair for your fall allergies. We also discussed your recent weight gain and decided to order some tests to check your cholesterol and thyroid function. Your plantar fasciitis has flared up recently, but we will continue with the current treatment plan.  YOUR PLAN:  -ASTHMA: Your asthma is stable with your current medication, Symbicort. However, due to the onset of fall allergies, you might consider restarting Singulair, a medication that helps control your allergies.  -DEPRESSION: Your mood has been stable with your current medication, Sertraline. We will continue with this treatment.  -HYPERLIPIDEMIA: Due to your recent weight gain and mildly elevated cholesterol last year, we will order a lipid panel and thyroid function tests. These tests will help [place] understand if there are any changes in your cholesterol levels and how your thyroid is functioning.  -PLANTAR FASCIITIS: Your plantar fasciitis, a condition that causes pain in the bottom of your heel, has flared up recently. We will continue with the current treatment plan.  INSTRUCTIONS:  For your general health, consider getting a COVID booster shot. You have an upcoming Pap smear in December. We will also order kidney function and electrolyte tests. For your earwax buildup, over-the-counter earwax drops are recommended. Please follow up in six months for asthma management.

## 2023-02-23 NOTE — Progress Notes (Signed)
Subjective:     Patient ID: Melanie Drake, female    DOB: 1996-01-01, 27 y.o.   MRN: [id number]  Chief Complaint  Patient presents with   Annual Exam    HPI  Discussed the use of AI scribe software for clinical note transcription with the patient, who gave verbal consent to proceed.  History of Present Illness         Patient presents today for complete physical.  Immunizations: flu and tetanus up to date Diet: trying to work on a healthy diet Wt Readings from Last 3 Encounters:  02/23/23 193 lb (87.5 kg)  08/20/22 183 lb (83 kg)  02/18/22 190 lb (86.2 kg)   Appetite increased with singulair.  Exercise: trying to exercise.  Did have recent plantar fasciitis. Pap Smear: she has an appointment in December  Vision: up to date Dental: up to date         Health Maintenance Due  Topic Date Due   Cervical Cancer Screening (Pap smear)  03/02/2023    Past Medical History:  Diagnosis Date   Asthma 05/2003   dx. by ped. doc.   Seasonal allergies     History reviewed. No pertinent surgical history.  Family History  Problem Relation Age of Onset   Hypertension Mother    Hypothyroidism Mother    Multiple sclerosis Mother    Hyperlipidemia Father    Bladder Cancer Maternal Grandfather    Diabetes Neg Hx    Kidney disease Neg Hx     Social History   Socioeconomic History   Marital status: Single    Spouse name: Not on file   Number of children: Not on file   Years of education: Not on file   Highest education level: Not on file  Occupational History   Occupation: Librarian, academic  Tobacco Use   Smoking status: Never   Smokeless tobacco: Never  Vaping Use   Vaping status: Never Used  Substance and Sexual Activity   Alcohol use: No   Drug use: No   Sexual activity: Never  Other Topics Concern   Not on file  Social History Narrative   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   Older brother- married   Enjoys reading, television   No pets    Lives with her parents   Social Determinants of Health   Financial Resource Strain: Low Risk  (08/19/2022)   Overall Financial Resource Strain (CARDIA)    Difficulty of Paying Living Expenses: Not very hard  Food Insecurity: No Food Insecurity (08/19/2022)   Hunger Vital Sign    Worried About Running Out of Food in the Last Year: Never true    Ran Out of Food in the Last Year: Never true  Transportation Needs: No Transportation Needs (08/19/2022)   PRAPARE - Administrator, Civil Service (Medical): No    Lack of Transportation (Non-Medical): No  Physical Activity: Insufficiently Active (08/19/2022)   Exercise Vital Sign    Days of Exercise per Week: 2 days    Minutes of Exercise per Session: 30 min  Stress: Stress Concern Present (08/19/2022)   [employer] of Occupational Health - Occupational Stress Questionnaire    Feeling of Stress : To some extent  Social Connections: Moderately Isolated (08/19/2022)   Social Connection and Isolation Panel [NHANES]    Frequency of Communication with Friends and Family: More than three times a week    Frequency of Social Gatherings with Friends and Family: Once  a week    Attends Religious Services: More than 4 times per year    Active Member of Clubs or Organizations: No    Attends Engineer, structural: Not on file    Marital Status: Never married  Intimate Partner Violence: Not on file    Outpatient Medications Prior to Visit  Medication Sig Dispense Refill   albuterol (VENTOLIN HFA) 108 (90 Base) MCG/ACT inhaler USE 2 PUFFS EVERY 6 HOURS AS NEEDED FOR SHORTNESS OF BREATH AND WHEEZING. 18 g 5   budesonide-formoterol (SYMBICORT) 160-4.5 MCG/ACT inhaler INHALE 1 PUFF INTO THE LUNGS TWICE DAILY. 10.2 each 5   montelukast (SINGULAIR) 10 MG tablet Take 1 tablet (10 mg total) by mouth at bedtime. 30 tablet 3   sertraline (ZOLOFT) 50 MG tablet Take 1&1/2 tablets (75 mg total) by mouth daily. 135 tablet 0   No  facility-administered medications prior to visit.    Allergies  Allergen Reactions   Septra [Bactrim] Rash    Review of Systems  Constitutional:  Negative for weight loss.  HENT:  Positive for congestion. Negative for hearing loss.   Eyes:  Negative for blurred vision.  Respiratory:  Negative for cough.   Cardiovascular:  Negative for leg swelling.  Gastrointestinal:  Negative for constipation and diarrhea.  Genitourinary:  Negative for dysuria and frequency.  Musculoskeletal:  Negative for joint pain and myalgias.  Skin:  Negative for rash.  Neurological:  Negative for headaches.  Endo/Heme/Allergies:  Positive for environmental allergies.  Psychiatric/Behavioral:         Reports stable mood on sertraline.        Objective:    Physical Exam   BP 128/76 (BP Location: Left Arm, Patient Position: Sitting, Cuff Size: Small)   Pulse 74   Temp 98.4 F (36.9 C) (Oral)   Resp 16   Ht 5' 6.5" (1.689 m)   Wt 193 lb (87.5 kg)   SpO2 99%   BMI 30.68 kg/m  Wt Readings from Last 3 Encounters:  02/23/23 193 lb (87.5 kg)  08/20/22 183 lb (83 kg)  02/18/22 190 lb (86.2 kg)   Physical Exam  Constitutional: She is oriented to person, place, and time. She appears well-developed and well-nourished. No distress.  HENT:  Head: Normocephalic and atraumatic.  Ears: bilateral cerumen impaction Mouth/Throat: Oropharynx is clear and moist.  Eyes: Pupils are equal, round, and reactive to light. No scleral icterus.  Neck: Normal range of motion. No thyromegaly present.  Cardiovascular: Normal rate and regular rhythm.   No murmur heard. Pulmonary/Chest: Effort normal and breath sounds normal. No respiratory distress. He has no wheezes. She has no rales. She exhibits no tenderness.  Abdominal: Soft. Bowel sounds are normal. She exhibits no distension and no mass. There is no tenderness. There is no rebound and no guarding.  Musculoskeletal: She exhibits no edema.  Lymphadenopathy:    She  has no cervical adenopathy.  Neurological: She is alert and oriented to person, place, and time. She has normal patellar reflexes. She exhibits normal muscle tone. Coordination normal.  Skin: Skin is warm and dry.  Psychiatric: She has a normal mood and affect. Her behavior is normal. Judgment and thought content normal.  Breast/Pelvic: deferred         Assessment & Plan:       Assessment & Plan:   Problem List Items Addressed This Visit       Unprioritized   Seasonal allergies    Uncontrolled. She plans to resume singulair but  does note that it seems to increase her appetite.       Preventative health care - Primary    -Continue effort with healthy diet, exercise and weight loss.  -Consider COVID booster. -Upcoming Pap smear in December. -Order kidney function and electrolyte tests. -Over-the-counter earwax drops recommended for earwax buildup.       Asthma    Stable. Continues symbicort regularly which has been stable.       Anxiety and depression    Reports stable mood on sertraline.  Monitor.       Other Visit Diagnoses     Hyperlipidemia, unspecified hyperlipidemia type       Relevant Orders   Comp Met (CMET)   Lipid panel   Weight gain       Relevant Orders   TSH       I am having Melanie Drake maintain her albuterol, montelukast, budesonide-formoterol, and sertraline.  No orders of the defined types were placed in this encounter.

## 2023-04-09 LAB — HM PAP SMEAR: HPV, high-risk: NOT DETECTED

## 2023-04-10 ENCOUNTER — Other Ambulatory Visit: Payer: Self-pay | Admitting: Family

## 2023-06-07 ENCOUNTER — Encounter: Payer: Self-pay | Admitting: Family

## 2023-06-09 ENCOUNTER — Telehealth: Payer: Self-pay | Admitting: Family

## 2023-06-09 ENCOUNTER — Ambulatory Visit: Payer: No Typology Code available for payment source | Admitting: Family

## 2023-06-09 ENCOUNTER — Ambulatory Visit (HOSPITAL_BASED_OUTPATIENT_CLINIC_OR_DEPARTMENT_OTHER)
Admission: RE | Admit: 2023-06-09 | Discharge: 2023-06-09 | Disposition: A | Discharge status: Home / Self Care | Payer: No Typology Code available for payment source | Source: Ambulatory Visit | Attending: Family | Admitting: Family

## 2023-06-09 VITALS — BP 128/80 | HR 104 | Temp 99.5°F | Resp 16 | Ht 66.5 in | Wt 192.0 lb

## 2023-06-09 DIAGNOSIS — R051 Acute cough: Secondary | ICD-10-CM | POA: Diagnosis not present

## 2023-06-09 MED ORDER — PROMETHAZINE-DM 6.25-15 MG/5ML PO SYRP
5.0000 mL | ORAL_SOLUTION | Freq: Four times a day (QID) | ORAL | 0 refills | Status: DC | PRN
Start: 2023-06-09 — End: 2023-08-28

## 2023-06-09 NOTE — Assessment & Plan Note (Addendum)
 Suspect resolving bronchitis.  Chest x-ray is normal- no sign of pneumonia.  Afebrile without wheezing on exam.  Continue symbicort . At this point would recommend rest, fluids, supportive care including mucinex prn, promethazine  DM prn. Call if symptoms worsen or if symptoms do not improve over the next 1 week.

## 2023-06-09 NOTE — Telephone Encounter (Signed)
 See mychart.

## 2023-06-09 NOTE — Progress Notes (Signed)
 m  Subjective:     Patient ID: Melanie Drake, female    DOB: 19-Jan-1996, 28 y.o.   MRN: [id number]  Chief Complaint  Patient presents with   Cough    Complains of persistent productive cough for about 15 days   Nasal Congestion    Patient complains of congestion for about 2 weeks    Cough    Discussed the use of AI scribe software for clinical note transcription with the patient, who gave verbal consent to proceed.  History of Present Illness   Melanie Drake is a 28 year old female who presents with persistent cough and congestion.  She has been experiencing a persistent cough and congestion for approximately two and a half weeks. The symptoms began with a tickle in her throat, initially attributed to weather changes, and progressed to congestion. Over-the-counter medications were ineffective, leading her to seek care at urgent care about a week later. She was prescribed prednisone and a Z-Pak, which she completed by Sunday. There was some improvement on Saturday, but symptoms worsened again by Sunday, with a persistent and somewhat productive cough and congestion.  The cough is described as persistent and somewhat productive, though she has not been able to expel sputum to examine it. Nasal congestion has varied in color from clear to light green, with occasional epistaxis. Congestion feels primarily in the upper chest area. She experienced a slight fever about a week ago, which led her to call out of work, but she has not had a fever in the last few days. No current shortness of breath, but there were occasional earlier episodes.  She completed her course of prednisone this morning. For her cough, she has been using Tigan and Robitussin during the day. She is concerned about her asthma exacerbating due to the cough.          Health Maintenance Due  Topic Date Due   Pneumococcal Vaccine 48-43 Years old (1 of 2 - PCV) Never done   COVID-19 Vaccine (5 - 2024-25 season) 01/04/2023    Cervical Cancer Screening (Pap smear)  03/02/2023    Past Medical History:  Diagnosis Date   Asthma 05/2003   dx. by ped. doc.   Seasonal allergies     No past surgical history on file.  Family History  Problem Relation Age of Onset   Hypertension Mother    Hypothyroidism Mother    Multiple sclerosis Mother    Hyperlipidemia Father    Bladder Cancer Maternal Grandfather    Diabetes Neg Hx    Kidney disease Neg Hx     Social History   Socioeconomic History   Marital status: Single    Spouse name: Not on file   Number of children: Not on file   Years of education: Not on file   Highest education level: Bachelor's degree (e.g., BA, AB, BS)  Occupational History   Occupation: Librarian, academic  Tobacco Use   Smoking status: Never   Smokeless tobacco: Never  Vaping Use   Vaping status: Never Used  Substance and Sexual Activity   Alcohol use: No   Drug use: No   Sexual activity: Never  Other Topics Concern   Not on file  Social History Narrative   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   Older brother- married   Enjoys reading, television   No pets   Lives with her parents   Social Drivers of Health   Financial Resource Strain: Low Risk  (06/08/2023)  Overall Financial Resource Strain (CARDIA)    Difficulty of Paying Living Expenses: Not hard at all  Food Insecurity: No Food Insecurity (06/08/2023)   Hunger Vital Sign    Worried About Running Out of Food in the Last Year: Never true    Ran Out of Food in the Last Year: Never true  Transportation Needs: No Transportation Needs (06/08/2023)   PRAPARE - Administrator, Civil Service (Medical): No    Lack of Transportation (Non-Medical): No  Physical Activity: Sufficiently Active (06/08/2023)   Exercise Vital Sign    Days of Exercise per Week: 2 days    Minutes of Exercise per Session: 90 min  Stress: No Stress Concern Present (06/08/2023)   [employer] of Occupational Health - Occupational  Stress Questionnaire    Feeling of Stress : Only a little  Social Connections: Moderately Isolated (06/08/2023)   Social Connection and Isolation Panel [NHANES]    Frequency of Communication with Friends and Family: More than three times a week    Frequency of Social Gatherings with Friends and Family: More than three times a week    Attends Religious Services: More than 4 times per year    Active Member of [employer] or Organizations: No    Attends Engineer, Structural: Not on file    Marital Status: Never married  Intimate Partner Violence: Not on file    Outpatient Medications Prior to Visit  Medication Sig Dispense Refill   albuterol  (VENTOLIN  HFA) 108 (90 Base) MCG/ACT inhaler USE 2 PUFFS EVERY 6 HOURS AS NEEDED FOR SHORTNESS OF BREATH AND WHEEZING. 18 g 5   budesonide -formoterol  (SYMBICORT ) 160-4.5 MCG/ACT inhaler INHALE 1 PUFF INTO THE LUNGS TWICE DAILY. 10.2 each 5   sertraline  (ZOLOFT ) 50 MG tablet Take 1&1/2 tablets (75 mg total) by mouth daily. 135 tablet 0   montelukast  (SINGULAIR ) 10 MG tablet Take 1 tablet (10 mg total) by mouth at bedtime. 30 tablet 3   No facility-administered medications prior to visit.    Allergies  Allergen Reactions   Septra [Bactrim] Rash    Review of Systems  Respiratory:  Positive for cough.        Objective:    Physical Exam Constitutional:      General: She is not in acute distress.    Appearance: Normal appearance. She is well-developed.  HENT:     Head: Normocephalic and atraumatic.     Right Ear: External ear normal. There is impacted cerumen.     Left Ear: External ear normal. There is impacted cerumen.  Eyes:     General: No scleral icterus. Neck:     Thyroid : No thyromegaly.  Cardiovascular:     Rate and Rhythm: Normal rate and regular rhythm.     Heart sounds: Normal heart sounds. No murmur heard. Pulmonary:     Effort: Pulmonary effort is normal. No respiratory distress.     Breath sounds: Normal breath sounds. No  wheezing.  Musculoskeletal:     Cervical back: Neck supple.  Skin:    General: Skin is warm and dry.  Neurological:     Mental Status: She is alert and oriented to person, place, and time.  Psychiatric:        Mood and Affect: Mood normal.        Behavior: Behavior normal.        Thought Content: Thought content normal.        Judgment: Judgment normal.      BP 128/80 (  BP Location: Left Arm, Patient Position: Sitting, Cuff Size: Normal)   Pulse (!) 104   Temp 99.5 F (37.5 C) (Oral)   Resp 16   Ht 5' 6.5 (1.689 m)   Wt 192 lb (87.1 kg)   SpO2 97%   BMI 30.53 kg/m  Wt Readings from Last 3 Encounters:  06/09/23 192 lb (87.1 kg)  02/23/23 193 lb (87.5 kg)  08/20/22 183 lb (83 kg)       Assessment & Plan:   Problem List Items Addressed This Visit       Unprioritized   Acute cough - Primary   Suspect resolving bronchitis.  Chest x-ray is normal- no sign of pneumonia.  Afebrile without wheezing on exam.  Continue symbicort . At this point would recommend rest, fluids, supportive care including mucinex prn. Call if symptoms worsen or if symptoms do not improve over the next 1 week.       Relevant Orders   DG Chest 2 View (Completed)    I have discontinued Jordanne Bailon's montelukast . I am also having her start on promethazine -dextromethorphan. Additionally, I am having her maintain her albuterol , budesonide -formoterol , and sertraline .  Meds ordered this encounter  Medications   promethazine -dextromethorphan (PROMETHAZINE -DM) 6.25-15 MG/5ML syrup    Sig: Take 5 mLs by mouth 4 (four) times daily as needed for cough.    Dispense:  118 mL    Refill:  0    Supervising Provider:   DOMENICA BLACKBIRD A [[id number]]

## 2023-07-10 ENCOUNTER — Other Ambulatory Visit: Payer: Self-pay | Admitting: Family

## 2023-08-25 ENCOUNTER — Ambulatory Visit: Payer: No Typology Code available for payment source | Admitting: Family

## 2023-08-28 ENCOUNTER — Ambulatory Visit: Payer: No Typology Code available for payment source | Admitting: Family

## 2023-08-28 ENCOUNTER — Encounter: Payer: Self-pay | Admitting: Family

## 2023-08-28 VITALS — BP 115/57 | HR 83 | Resp 16 | Ht 66.5 in | Wt 196.0 lb

## 2023-08-28 DIAGNOSIS — H6123 Impacted cerumen, bilateral: Secondary | ICD-10-CM | POA: Diagnosis not present

## 2023-08-28 DIAGNOSIS — R739 Hyperglycemia, unspecified: Secondary | ICD-10-CM | POA: Insufficient documentation

## 2023-08-28 DIAGNOSIS — J45909 Unspecified asthma, uncomplicated: Secondary | ICD-10-CM | POA: Diagnosis not present

## 2023-08-28 DIAGNOSIS — F419 Anxiety disorder, unspecified: Secondary | ICD-10-CM

## 2023-08-28 DIAGNOSIS — E785 Hyperlipidemia, unspecified: Secondary | ICD-10-CM | POA: Diagnosis not present

## 2023-08-28 DIAGNOSIS — F32A Depression, unspecified: Secondary | ICD-10-CM

## 2023-08-28 LAB — LIPID PANEL
Cholesterol: 241 mg/dL — ABNORMAL HIGH (ref 0–200)
HDL: 62.5 mg/dL (ref >39.00)
LDL Cholesterol: 166 mg/dL — ABNORMAL HIGH (ref 0–99)
NonHDL: 178.81
Total CHOL/HDL Ratio: 4
Triglycerides: 66 mg/dL (ref 0.0–149.0)
VLDL: 13.2 mg/dL (ref 0.0–40.0)

## 2023-08-28 LAB — HEMOGLOBIN A1C: Hgb A1c MFr Bld: 5.5 % (ref 4.6–6.5)

## 2023-08-28 NOTE — Patient Instructions (Signed)
 Please complete lab work prior to leaving. Continue to work on a healthy diet, exercise and weight loss.

## 2023-08-28 NOTE — Assessment & Plan Note (Signed)
 Glucose mildly elevated last visit. Check A1C.

## 2023-08-28 NOTE — Assessment & Plan Note (Signed)
 Well controlled with symbicort  pre-workout use of albuterol . Continue same.

## 2023-08-28 NOTE — Assessment & Plan Note (Signed)
 Stable on sertraline Continue same.

## 2023-08-28 NOTE — Assessment & Plan Note (Signed)
 Pt requested irrigation of ears today.  After verbal consent pt's ears were flushed by CMA. Pt tolerated procedure well.

## 2023-08-28 NOTE — Progress Notes (Signed)
 Subjective:     Patient ID: Melanie Drake, female    DOB: 13-Jul-1995, 28 y.o.   MRN: [id number]  Chief Complaint  Patient presents with   Depression    "Doing well on medication"    Depression         Discussed the use of AI scribe software for clinical note transcription with the patient, who gave verbal consent to proceed.  History of Present Illness  Pt presents today for her 6 month follow up. Has no new concerns. Pap up to date per GYN.   Depression- maintained on sertraline  75mg .  Report good mood on current dose.  Tolerating without side effect.   Asthma- Using symbicort  bid, has not had to use albuterol  unless she pre-medicates for the gym    Pap smear- up to date at [facility].  Wt Readings from Last 3 Encounters:  08/28/23 196 lb (88.9 kg)  06/09/23 192 lb (87.1 kg)  02/23/23 193 lb (87.5 kg)         Health Maintenance Due  Topic Date Due   Pneumococcal Vaccine 70-72 Years old (1 of 2 - PCV) Never done   COVID-19 Vaccine (5 - 2024-25 season) 01/04/2023   Cervical Cancer Screening (Pap smear)  03/02/2023    Past Medical History:  Diagnosis Date   Asthma 05/2003   dx. by ped. doc.   Seasonal allergies     History reviewed. No pertinent surgical history.  Family History  Problem Relation Age of Onset   Hypertension Mother    Hypothyroidism Mother    Multiple sclerosis Mother    Hyperlipidemia Father    Bladder Cancer Maternal Grandfather    Diabetes Neg Hx    Kidney disease Neg Hx     Social History   Socioeconomic History   Marital status: Single    Spouse name: Not on file   Number of children: Not on file   Years of education: Not on file   Highest education level: Bachelor's degree (e.g., BA, AB, BS)  Occupational History   Occupation: Librarian, academic  Tobacco Use   Smoking status: Never   Smokeless tobacco: Never  Vaping Use   Vaping status: Never Used  Substance and Sexual Activity   Alcohol use: No   Drug use:  No   Sexual activity: Never  Other Topics Concern   Not on file  Social History Narrative   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   Older brother- married   Enjoys reading, television   No pets   Lives with her parents   Social Drivers of Health   Financial Resource Strain: Low Risk  (06/08/2023)   Overall Financial Resource Strain (CARDIA)    Difficulty of Paying Living Expenses: Not hard at all  Food Insecurity: No Food Insecurity (06/08/2023)   Hunger Vital Sign    Worried About Running Out of Food in the Last Year: Never true    Ran Out of Food in the Last Year: Never true  Transportation Needs: No Transportation Needs (06/08/2023)   PRAPARE - Administrator, Civil Service (Medical): No    Lack of Transportation (Non-Medical): No  Physical Activity: Sufficiently Active (06/08/2023)   Exercise Vital Sign    Days of Exercise per Week: 2 days    Minutes of Exercise per Session: 90 min  Stress: No Stress Concern Present (06/08/2023)   [employer] of Occupational Health - Occupational Stress Questionnaire    Feeling of  Stress : Only a little  Social Connections: Moderately Isolated (06/08/2023)   Social Connection and Isolation Panel [NHANES]    Frequency of Communication with Friends and Family: More than three times a week    Frequency of Social Gatherings with Friends and Family: More than three times a week    Attends Religious Services: More than 4 times per year    Active Member of [employer] or Organizations: No    Attends Engineer, structural: Not on file    Marital Status: Never married  Intimate Partner Violence: Not on file    Outpatient Medications Prior to Visit  Medication Sig Dispense Refill   albuterol  (VENTOLIN  HFA) 108 (90 Base) MCG/ACT inhaler USE 2 PUFFS EVERY 6 HOURS AS NEEDED FOR SHORTNESS OF BREATH AND WHEEZING. 18 g 5   budesonide -formoterol  (SYMBICORT ) 160-4.5 MCG/ACT inhaler INHALE 1 PUFF INTO THE LUNGS TWICE DAILY. 10.2 each 5    sertraline  (ZOLOFT ) 50 MG tablet Take 1&1/2 tablets (75 mg total) by mouth daily. 135 tablet 0   promethazine -dextromethorphan (PROMETHAZINE -DM) 6.25-15 MG/5ML syrup Take 5 mLs by mouth 4 (four) times daily as needed for cough. 118 mL 0   No facility-administered medications prior to visit.    Allergies  Allergen Reactions   Septra [Bactrim] Rash    Review of Systems  Psychiatric/Behavioral:  Positive for depression.        Objective:    Physical Exam Constitutional:      General: She is not in acute distress.    Appearance: Normal appearance. She is well-developed.  HENT:     Head: Normocephalic and atraumatic.     Right Ear: External ear normal. There is impacted cerumen.     Left Ear: External ear normal. There is impacted cerumen.  Eyes:     General: No scleral icterus. Neck:     Thyroid : No thyromegaly.  Cardiovascular:     Rate and Rhythm: Normal rate and regular rhythm.     Heart sounds: Normal heart sounds. No murmur heard. Pulmonary:     Effort: Pulmonary effort is normal. No respiratory distress.     Breath sounds: Normal breath sounds. No wheezing.  Musculoskeletal:     Cervical back: Neck supple.  Skin:    General: Skin is warm and dry.  Neurological:     Mental Status: She is alert and oriented to person, place, and time.  Psychiatric:        Mood and Affect: Mood normal.        Behavior: Behavior normal.        Thought Content: Thought content normal.        Judgment: Judgment normal.      BP (!) 115/57 (BP Location: Left Arm, Patient Position: Sitting)   Pulse 83   Resp 16   Ht 5' 6.5" (1.689 m)   Wt 196 lb (88.9 kg)   SpO2 99%   BMI 31.16 kg/m  Wt Readings from Last 3 Encounters:  08/28/23 196 lb (88.9 kg)  06/09/23 192 lb (87.1 kg)  02/23/23 193 lb (87.5 kg)       Assessment & Plan:   Problem List Items Addressed This Visit       Unprioritized   Hyperlipidemia - Primary   LDL very high last visit. Will update lipid panel  today.       Relevant Orders   Lipid panel   Hyperglycemia   Glucose mildly elevated last visit. Check A1C.       Relevant  Orders   HgB A1c   Ceruminosis, bilateral   Pt requested irrigation of ears today.  After verbal consent pt's ears were flushed by CMA. Pt tolerated procedure well.       Asthma   Well controlled with symbicort  pre-workout use of albuterol . Continue same.      Anxiety and depression   Stable on sertraline . Continue same.        I have discontinued Mc Esses's promethazine -dextromethorphan. I am also having her maintain her albuterol , budesonide -formoterol , and sertraline .  No orders of the defined types were placed in this encounter.

## 2023-08-28 NOTE — Assessment & Plan Note (Signed)
 LDL very high last visit. Will update lipid panel today.

## 2023-08-30 ENCOUNTER — Encounter: Payer: Self-pay | Admitting: Family

## 2023-09-23 ENCOUNTER — Other Ambulatory Visit: Payer: Self-pay | Admitting: Family

## 2023-09-28 ENCOUNTER — Other Ambulatory Visit: Payer: Self-pay | Admitting: Family

## 2023-10-23 ENCOUNTER — Other Ambulatory Visit: Payer: Self-pay | Admitting: Family

## 2024-01-13 ENCOUNTER — Other Ambulatory Visit: Payer: Self-pay | Admitting: Family

## 2024-03-02 ENCOUNTER — Encounter: Payer: Self-pay | Admitting: Family

## 2024-03-02 ENCOUNTER — Ambulatory Visit: Admitting: Family

## 2024-03-02 VITALS — BP 116/68 | HR 89 | Temp 98.8°F | Resp 16 | Ht 66.0 in | Wt 199.8 lb

## 2024-03-02 DIAGNOSIS — Z0000 Encounter for general adult medical examination without abnormal findings: Secondary | ICD-10-CM

## 2024-03-02 DIAGNOSIS — Z23 Encounter for immunization: Secondary | ICD-10-CM

## 2024-03-02 DIAGNOSIS — F419 Anxiety disorder, unspecified: Secondary | ICD-10-CM

## 2024-03-02 DIAGNOSIS — J45909 Unspecified asthma, uncomplicated: Secondary | ICD-10-CM

## 2024-03-02 DIAGNOSIS — F32A Depression, unspecified: Secondary | ICD-10-CM

## 2024-03-02 DIAGNOSIS — H6123 Impacted cerumen, bilateral: Secondary | ICD-10-CM

## 2024-03-02 MED ORDER — BUDESONIDE-FORMOTEROL FUMARATE 160-4.5 MCG/ACT IN AERO: 1.0000 | INHALATION_SPRAY | Freq: Two times a day (BID) | RESPIRATORY_TRACT | 5 refills | Status: AC

## 2024-03-02 MED ORDER — ALBUTEROL SULFATE HFA 108 (90 BASE) MCG/ACT IN AERS: INHALATION_SPRAY | RESPIRATORY_TRACT | 0 refills | Status: AC

## 2024-03-02 MED ORDER — SERTRALINE HCL 50 MG PO TABS: 75.0000 mg | ORAL_TABLET | Freq: Every day | ORAL | 1 refills | Status: AC

## 2024-03-02 NOTE — Patient Instructions (Signed)
 VISIT SUMMARY:  Today, you came in for your annual physical exam. We discussed your asthma management, mental health, and some lifestyle changes you are working on. You also mentioned a recent cold and some earwax buildup.  YOUR PLAN:  -ADULT WELLNESS VISIT: This visit was a routine check-up to review your overall health. We administered a pneumonia vaccine because of your asthma, and I encouraged you to maintain a healthy diet and regular exercise. We also discussed meal prep strategies to help you eat healthier. We will plan for a mammogram when you turn 38 and a colonoscopy at age 39.  -ASTHMA: Asthma is a condition where your airways narrow and swell, making it hard to breathe. Your asthma is well-managed with Symbicort  and occasional use of an albuterol  inhaler. We have refilled your prescriptions for both medications.  -ANXIETY AND DEPRESSION: Anxiety and depression are mental health conditions that affect your mood and overall well-being. Your symptoms are stable with the use of Zoloft . We have refilled your prescription for Zoloft .  -EARWAX BUILDUP: You have a significant buildup of earwax, which can be common in some families. I recommend using over-the-counter wax removal kits weekly to manage this issue.  INSTRUCTIONS:  Please follow up with any concerns or if your symptoms change. Continue using your medications as prescribed. Plan for a mammogram at age 65 and a colonoscopy at age 81.

## 2024-03-02 NOTE — Progress Notes (Signed)
 Subjective:     Patient ID: Melanie Drake, female    DOB: 18-Mar-1996, 28 y.o.   MRN: [id number]  Chief Complaint  Patient presents with   Annual Exam    HPI  Discussed the use of AI scribe software for clinical note transcription with the patient, who gave verbal consent to proceed.  History of Present Illness  Melanie Drake is a 28 year old female who presents for an annual physical exam.  She manages asthma with Symbicort  and uses an albuterol  inhaler one to three times weekly, particularly during allergy flare-ups. Her depression and anxiety are stable on Zoloft  75 mg daily. She acknowledges stress eating due to work-related stress and aims to improve her diet and exercise. She recently moved out of her parents' home, affecting her meal preparation habits.  She experienced a cold two to three weeks ago, with a persistent throat tickle. She denies skin rashes, leg swelling, hearing or vision problems, digestive issues, urinary concerns, unusual muscle or joint pain, or frequent headaches.  Her last pap smear was in December 2024, and she is current with eye and dental exams. She is not sexually active and abstains from tobacco, alcohol, and drugs.  Immunizations:  due for prevnar Diet: needs improvement Exercise:  needs improvement Pap Smear: 12/24 Vision: up to date Dental: up to date     Health Maintenance Due  Topic Date Due   COVID-19 Vaccine (5 - 2025-26 season) 01/04/2024    Past Medical History:  Diagnosis Date   Asthma 05/2003   dx. by ped. doc.   Seasonal allergies     History reviewed. No pertinent surgical history.  Family History  Problem Relation Age of Onset   Hypertension Mother    Hypothyroidism Mother    Multiple sclerosis Mother    Hyperlipidemia Father    Bladder Cancer Maternal Grandfather    Diabetes Neg Hx    Kidney disease Neg Hx     Social History   Socioeconomic History   Marital status: Single    Spouse name: Not on file    Number of children: Not on file   Years of education: Not on file   Highest education level: Bachelor's degree (e.g., BA, AB, BS)  Occupational History   Occupation: Librarian, academic  Tobacco Use   Smoking status: Never   Smokeless tobacco: Never  Vaping Use   Vaping status: Never Used  Substance and Sexual Activity   Alcohol use: No   Drug use: No   Sexual activity: Never  Other Topics Concern   Not on file  Social History Narrative   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   Older brother- married   Enjoys reading, television   No pets   Lives on her own, owns her own house    Social Drivers of Health   Financial Resource Strain: Low Risk  (02/24/2024)   Overall Financial Resource Strain (CARDIA)    Difficulty of Paying Living Expenses: Not very hard  Food Insecurity: No Food Insecurity (02/24/2024)   Hunger Vital Sign    Worried About Running Out of Food in the Last Year: Never true    Ran Out of Food in the Last Year: Never true  Transportation Needs: No Transportation Needs (02/24/2024)   PRAPARE - Administrator, Civil Service (Medical): No    Lack of Transportation (Non-Medical): No  Physical Activity: Insufficiently Active (02/24/2024)   Exercise Vital Sign    Days of Exercise  per Week: 3 days    Minutes of Exercise per Session: 30 min  Stress: Stress Concern Present (02/24/2024)   [employer] of Occupational Health - Occupational Stress Questionnaire    Feeling of Stress: To some extent  Social Connections: Moderately Isolated (02/24/2024)   Social Connection and Isolation Panel    Frequency of Communication with Friends and Family: More than three times a week    Frequency of Social Gatherings with Friends and Family: Three times a week    Attends Religious Services: More than 4 times per year    Active Member of Clubs or Organizations: No    Attends Engineer, Structural: Not on file    Marital Status: Never married   Intimate Partner Violence: Not on file    Outpatient Medications Prior to Visit  Medication Sig Dispense Refill   albuterol  (VENTOLIN  HFA) 108 (90 Base) MCG/ACT inhaler USE 2 PUFFS EVERY 6 HOURS AS NEEDED FOR SHORTNESS OF BREATH AND WHEEZING. 18 g 0   budesonide -formoterol  (SYMBICORT ) 160-4.5 MCG/ACT inhaler Inhale 1 puff into the lungs in the morning and at bedtime. 10.2 g 5   sertraline  (ZOLOFT ) 50 MG tablet Take 1.5 tablets (75 mg total) by mouth daily. 135 tablet 0   No facility-administered medications prior to visit.    Allergies  Allergen Reactions   Septra [Bactrim] Rash    Review of Systems  Constitutional:  Negative for weight loss.  HENT:  Negative for congestion and hearing loss.   Eyes:  Negative for blurred vision.  Respiratory:  Negative for cough.   Cardiovascular:  Negative for leg swelling.  Gastrointestinal:  Negative for constipation and diarrhea.  Genitourinary:  Negative for dysuria and frequency.  Musculoskeletal:  Negative for joint pain and myalgias.  Skin:  Negative for rash.  Neurological:  Negative for headaches.  Psychiatric/Behavioral:  Negative for depression. The patient is not nervous/anxious.        Objective:    Physical Exam   BP 116/68 (BP Location: Left Arm, Patient Position: Sitting, Cuff Size: Normal)   Pulse 89   Temp 98.8 F (37.1 C) (Oral)   Resp 16   Ht 5' 6 (1.676 m)   Wt 199 lb 12.8 oz (90.6 kg)   SpO2 96%   BMI 32.25 kg/m  Wt Readings from Last 3 Encounters:  03/02/24 199 lb 12.8 oz (90.6 kg)  08/28/23 196 lb (88.9 kg)  06/09/23 192 lb (87.1 kg)   Physical Exam  Constitutional: She is oriented to person, place, and time. She appears well-developed and well-nourished. No distress.  HENT:  Head: Normocephalic and atraumatic.  Ears: bilateral ceruminosis Mouth/Throat: Oropharynx is clear and moist.  Eyes: Pupils are equal, round, and reactive to light. No scleral icterus.  Neck: Normal range of motion. No  thyromegaly present.  Cardiovascular: Normal rate and regular rhythm.   No murmur heard. Pulmonary/Chest: Effort normal and breath sounds normal. No respiratory distress. He has no wheezes. She has no rales. She exhibits no tenderness.  Abdominal: Soft. Bowel sounds are normal. She exhibits no distension and no mass. There is no tenderness. There is no rebound and no guarding.  Musculoskeletal: She exhibits no edema.  Lymphadenopathy:    She has no cervical adenopathy.  Neurological: She is alert and oriented to person, place, and time. She has normal patellar reflexes. She exhibits normal muscle tone. Coordination normal.  Skin: Skin is warm and dry.  Psychiatric: She has a normal mood and affect. Her behavior is normal.  Judgment and thought content normal.  Breast/Pelvic: deferred            Assessment & Plan:       Assessment & Plan:   Problem List Items Addressed This Visit       Unprioritized   Preventative health care - Primary   Discussed healthy diet, exercise and weight loss.  Pap up to date. Prevnar today. Flu shot up to date.       Ceruminosis, bilateral   Pt requested irrigation of ears today.  After verbal consent pt's ears were flushed by CMA. Pt tolerated procedure well.   Recommend use of OTC wax removal kits to prevent future buildup.      Asthma   Stable on symbicort  and prn albuterol . Continue same.      Relevant Medications   budesonide -formoterol  (SYMBICORT ) 160-4.5 MCG/ACT inhaler   albuterol  (VENTOLIN  HFA) 108 (90 Base) MCG/ACT inhaler   Anxiety and depression   Stable on sertraline  75mg .  Continue same.       Relevant Medications   sertraline  (ZOLOFT ) 50 MG tablet   Other Visit Diagnoses       Need for pneumococcal 20-valent conjugate vaccination       Relevant Orders   Pneumococcal conjugate vaccine 20-valent (Prevnar 20) (Completed)       I am having Darina Hartwell maintain her sertraline , budesonide -formoterol , and  albuterol .  Meds ordered this encounter  Medications   sertraline  (ZOLOFT ) 50 MG tablet    Sig: Take 1.5 tablets (75 mg total) by mouth daily.    Dispense:  135 tablet    Refill:  1    Supervising Provider:   DOMENICA BLACKBIRD A [[id number]]   budesonide -formoterol  (SYMBICORT ) 160-4.5 MCG/ACT inhaler    Sig: Inhale 1 puff into the lungs in the morning and at bedtime.    Dispense:  10.2 g    Refill:  5    Supervising Provider:   DOMENICA BLACKBIRD A [[id number]]   albuterol  (VENTOLIN  HFA) 108 (90 Base) MCG/ACT inhaler    Sig: USE 2 PUFFS EVERY 6 HOURS AS NEEDED FOR SHORTNESS OF BREATH AND WHEEZING.    Dispense:  18 g    Refill:  0    Supervising Provider:   DOMENICA BLACKBIRD A [[id number]]

## 2024-03-02 NOTE — Assessment & Plan Note (Signed)
Stable on symbicort and prn albuterol. Continue same.

## 2024-03-02 NOTE — Assessment & Plan Note (Signed)
 Stable on sertraline  75mg .  Continue same.

## 2024-03-02 NOTE — Assessment & Plan Note (Signed)
 Discussed healthy diet, exercise and weight loss.  Pap up to date. Prevnar today. Flu shot up to date.

## 2024-03-02 NOTE — Assessment & Plan Note (Addendum)
 Pt requested irrigation of ears today.  After verbal consent pt's ears were flushed by CMA. Pt tolerated procedure well.   Recommend use of OTC wax removal kits to prevent future buildup.

## 2024-08-31 ENCOUNTER — Ambulatory Visit: Admitting: Family
